# Patient Record
Sex: Male | Born: 1980 | Race: Black or African American | Hispanic: No | Marital: Single | Smoking: Former smoker
Health system: Southern US, Community
[De-identification: ages and names within clinical notes are randomized; demographics above are authoritative.]

## PROBLEM LIST (undated history)

## (undated) DIAGNOSIS — I10 Essential (primary) hypertension: Secondary | ICD-10-CM

## (undated) DIAGNOSIS — L509 Urticaria, unspecified: Secondary | ICD-10-CM

## (undated) HISTORY — PX: ANTERIOR CRUCIATE LIGAMENT REPAIR: SHX115

## (undated) HISTORY — PX: MENISCUS REPAIR: SHX5179

---

## 2010-08-29 ENCOUNTER — Emergency Department (HOSPITAL_COMMUNITY): Payer: Medicaid - Out of State

## 2010-08-29 ENCOUNTER — Emergency Department (HOSPITAL_COMMUNITY)
Admission: EM | Admit: 2010-08-29 | Discharge: 2010-08-29 | Disposition: A | Discharge status: Home / Self Care | Payer: Medicaid - Out of State | Attending: Emergency Medicine | Admitting: Emergency Medicine

## 2010-08-29 DIAGNOSIS — R079 Chest pain, unspecified: Secondary | ICD-10-CM | POA: Insufficient documentation

## 2010-08-29 LAB — CBC
HCT: 39.5 % (ref 39.0–52.0)
Hemoglobin: 13.5 g/dL (ref 13.0–17.0)
RBC: 4.47 MIL/uL (ref 4.22–5.81)
WBC: 13.1 10*3/uL — ABNORMAL HIGH (ref 4.0–10.5)

## 2010-08-29 LAB — POCT I-STAT, CHEM 8
Creatinine, Ser: 1.1 mg/dL (ref 0.4–1.5)
Glucose, Bld: 153 mg/dL — ABNORMAL HIGH (ref 70–99)
Hemoglobin: 13.9 g/dL (ref 13.0–17.0)
Potassium: 4.4 mEq/L (ref 3.5–5.1)

## 2010-08-29 LAB — POCT CARDIAC MARKERS
CKMB, poc: <1 ng/mL — ABNORMAL LOW (ref 1.0–8.0)
CKMB, poc: <1 ng/mL — ABNORMAL LOW (ref 1.0–8.0)
Myoglobin, poc: 194 ng/mL (ref 12–200)
Troponin i, poc: <0.05 ng/mL (ref 0.00–0.09)

## 2010-08-29 LAB — DIFFERENTIAL
Basophils Absolute: 0 10*3/uL (ref 0.0–0.1)
Lymphocytes Relative: 13 % (ref 12–46)
Monocytes Absolute: 1.4 10*3/uL — ABNORMAL HIGH (ref 0.1–1.0)
Neutro Abs: 10 10*3/uL — ABNORMAL HIGH (ref 1.7–7.7)

## 2010-08-29 MED ORDER — IOHEXOL 300 MG/ML  SOLN
80.0000 mL | Freq: Once | INTRAMUSCULAR | Status: AC | PRN
  Administered 2010-08-29: 80 mL via INTRAVENOUS

## 2010-08-30 ENCOUNTER — Inpatient Hospital Stay (INDEPENDENT_AMBULATORY_CARE_PROVIDER_SITE_OTHER)
Admission: RE | Admit: 2010-08-30 | Discharge: 2010-08-30 | Disposition: A | Discharge status: Home / Self Care | Payer: Medicaid - Out of State | Source: Ambulatory Visit | Attending: Family Medicine | Admitting: Family Medicine

## 2010-08-30 DIAGNOSIS — M79609 Pain in unspecified limb: Secondary | ICD-10-CM

## 2010-09-06 ENCOUNTER — Ambulatory Visit: Payer: Medicaid - Out of State | Attending: Emergency Medicine | Admitting: Physical Therapy

## 2010-09-06 DIAGNOSIS — IMO0001 Reserved for inherently not codable concepts without codable children: Secondary | ICD-10-CM | POA: Insufficient documentation

## 2010-09-06 DIAGNOSIS — M25669 Stiffness of unspecified knee, not elsewhere classified: Secondary | ICD-10-CM | POA: Insufficient documentation

## 2010-09-06 DIAGNOSIS — R262 Difficulty in walking, not elsewhere classified: Secondary | ICD-10-CM | POA: Insufficient documentation

## 2010-09-06 DIAGNOSIS — M25569 Pain in unspecified knee: Secondary | ICD-10-CM | POA: Insufficient documentation

## 2010-11-13 ENCOUNTER — Ambulatory Visit: Payer: Medicaid Other | Attending: Emergency Medicine

## 2010-11-13 DIAGNOSIS — IMO0001 Reserved for inherently not codable concepts without codable children: Secondary | ICD-10-CM | POA: Insufficient documentation

## 2010-11-13 DIAGNOSIS — M25569 Pain in unspecified knee: Secondary | ICD-10-CM | POA: Insufficient documentation

## 2010-11-13 DIAGNOSIS — M25669 Stiffness of unspecified knee, not elsewhere classified: Secondary | ICD-10-CM | POA: Insufficient documentation

## 2010-11-13 DIAGNOSIS — R262 Difficulty in walking, not elsewhere classified: Secondary | ICD-10-CM | POA: Insufficient documentation

## 2010-11-15 ENCOUNTER — Ambulatory Visit: Payer: Medicaid Other | Admitting: Physical Therapy

## 2010-11-21 ENCOUNTER — Encounter: Payer: Medicaid - Out of State | Admitting: Physical Therapy

## 2010-11-22 ENCOUNTER — Ambulatory Visit: Payer: Medicaid Other

## 2010-11-23 ENCOUNTER — Ambulatory Visit: Payer: Medicaid Other

## 2010-11-26 ENCOUNTER — Ambulatory Visit: Payer: Medicaid Other | Admitting: Physical Therapy

## 2010-11-28 ENCOUNTER — Ambulatory Visit: Payer: Medicaid Other

## 2010-11-30 ENCOUNTER — Ambulatory Visit: Payer: Medicaid Other

## 2010-12-10 ENCOUNTER — Ambulatory Visit: Payer: Medicaid Other | Attending: Emergency Medicine

## 2010-12-10 DIAGNOSIS — M25669 Stiffness of unspecified knee, not elsewhere classified: Secondary | ICD-10-CM | POA: Insufficient documentation

## 2010-12-10 DIAGNOSIS — R262 Difficulty in walking, not elsewhere classified: Secondary | ICD-10-CM | POA: Insufficient documentation

## 2010-12-10 DIAGNOSIS — IMO0001 Reserved for inherently not codable concepts without codable children: Secondary | ICD-10-CM | POA: Insufficient documentation

## 2010-12-10 DIAGNOSIS — M25569 Pain in unspecified knee: Secondary | ICD-10-CM | POA: Insufficient documentation

## 2010-12-26 ENCOUNTER — Ambulatory Visit: Payer: Medicaid Other

## 2010-12-27 ENCOUNTER — Ambulatory Visit: Payer: Medicaid Other

## 2010-12-28 ENCOUNTER — Ambulatory Visit: Payer: Medicaid Other

## 2011-01-02 ENCOUNTER — Ambulatory Visit: Payer: Medicaid Other

## 2011-01-03 ENCOUNTER — Ambulatory Visit: Payer: Medicaid Other

## 2011-01-04 ENCOUNTER — Ambulatory Visit: Payer: Medicaid Other

## 2011-01-07 ENCOUNTER — Ambulatory Visit: Payer: Medicaid Other

## 2011-01-08 ENCOUNTER — Ambulatory Visit: Payer: Medicaid Other

## 2011-01-10 ENCOUNTER — Ambulatory Visit: Payer: Medicaid Other | Attending: Emergency Medicine

## 2011-01-10 DIAGNOSIS — M25669 Stiffness of unspecified knee, not elsewhere classified: Secondary | ICD-10-CM | POA: Insufficient documentation

## 2011-01-10 DIAGNOSIS — IMO0001 Reserved for inherently not codable concepts without codable children: Secondary | ICD-10-CM | POA: Insufficient documentation

## 2011-01-10 DIAGNOSIS — R262 Difficulty in walking, not elsewhere classified: Secondary | ICD-10-CM | POA: Insufficient documentation

## 2011-01-10 DIAGNOSIS — M25569 Pain in unspecified knee: Secondary | ICD-10-CM | POA: Insufficient documentation

## 2011-01-11 ENCOUNTER — Ambulatory Visit: Payer: Medicaid Other

## 2011-01-22 ENCOUNTER — Ambulatory Visit: Payer: Medicaid Other

## 2011-01-25 ENCOUNTER — Ambulatory Visit: Payer: Medicaid Other

## 2011-01-28 ENCOUNTER — Ambulatory Visit: Payer: Medicaid Other

## 2011-01-30 ENCOUNTER — Ambulatory Visit: Payer: Medicaid Other

## 2011-02-01 ENCOUNTER — Ambulatory Visit: Payer: Medicaid Other

## 2011-02-13 ENCOUNTER — Ambulatory Visit: Payer: Medicaid Other | Attending: Emergency Medicine

## 2011-02-13 DIAGNOSIS — M25669 Stiffness of unspecified knee, not elsewhere classified: Secondary | ICD-10-CM | POA: Insufficient documentation

## 2011-02-13 DIAGNOSIS — R262 Difficulty in walking, not elsewhere classified: Secondary | ICD-10-CM | POA: Insufficient documentation

## 2011-02-13 DIAGNOSIS — IMO0001 Reserved for inherently not codable concepts without codable children: Secondary | ICD-10-CM | POA: Insufficient documentation

## 2011-02-13 DIAGNOSIS — M25569 Pain in unspecified knee: Secondary | ICD-10-CM | POA: Insufficient documentation

## 2011-02-21 ENCOUNTER — Ambulatory Visit: Payer: Medicaid Other

## 2011-02-26 ENCOUNTER — Ambulatory Visit: Payer: Medicaid Other

## 2011-05-18 ENCOUNTER — Emergency Department (HOSPITAL_COMMUNITY): Payer: Medicaid Other

## 2011-05-18 ENCOUNTER — Emergency Department (HOSPITAL_COMMUNITY)
Admission: EM | Admit: 2011-05-18 | Discharge: 2011-05-18 | Disposition: A | Discharge status: Home / Self Care | Payer: Medicaid Other | Attending: Emergency Medicine | Admitting: Emergency Medicine

## 2011-05-18 ENCOUNTER — Encounter: Payer: Self-pay | Admitting: *Deleted

## 2011-05-18 ENCOUNTER — Other Ambulatory Visit: Payer: Self-pay

## 2011-05-18 DIAGNOSIS — R0602 Shortness of breath: Secondary | ICD-10-CM | POA: Insufficient documentation

## 2011-05-18 DIAGNOSIS — M79609 Pain in unspecified limb: Secondary | ICD-10-CM | POA: Insufficient documentation

## 2011-05-18 DIAGNOSIS — R079 Chest pain, unspecified: Secondary | ICD-10-CM | POA: Insufficient documentation

## 2011-05-18 DIAGNOSIS — I10 Essential (primary) hypertension: Secondary | ICD-10-CM | POA: Insufficient documentation

## 2011-05-18 HISTORY — DX: Essential (primary) hypertension: I10

## 2011-05-18 LAB — BASIC METABOLIC PANEL
CO2: 29 mEq/L (ref 19–32)
Chloride: 103 mEq/L (ref 96–112)
GFR calc Af Amer: >90 mL/min (ref >90)
Sodium: 142 mEq/L (ref 135–145)

## 2011-05-18 LAB — CBC
Platelets: 242 10*3/uL (ref 150–400)
RBC: 5.08 MIL/uL (ref 4.22–5.81)
WBC: 10.5 10*3/uL (ref 4.0–10.5)

## 2011-05-18 LAB — POCT I-STAT TROPONIN I
Troponin i, poc: 0 ng/mL (ref 0.00–0.08)
Troponin i, poc: 0.01 ng/mL (ref 0.00–0.08)

## 2011-05-18 MED ORDER — POTASSIUM CHLORIDE CRYS ER 20 MEQ PO TBCR
20.0000 meq | EXTENDED_RELEASE_TABLET | Freq: Once | ORAL | Status: AC
  Administered 2011-05-18: 20 meq via ORAL
  Filled 2011-05-18: qty 1

## 2011-05-18 MED ORDER — NITROGLYCERIN 0.4 MG SL SUBL
0.4000 mg | SUBLINGUAL_TABLET | SUBLINGUAL | Status: DC | PRN
  Administered 2011-05-18: 0.4 mg via SUBLINGUAL
  Filled 2011-05-18: qty 25

## 2011-05-18 MED ORDER — ASPIRIN 81 MG PO CHEW
324.0000 mg | CHEWABLE_TABLET | Freq: Once | ORAL | Status: AC
  Administered 2011-05-18: 324 mg via ORAL
  Filled 2011-05-18: qty 4

## 2011-05-18 MED ORDER — IOHEXOL 300 MG/ML  SOLN
100.0000 mL | Freq: Once | INTRAMUSCULAR | Status: AC | PRN
  Administered 2011-05-18: 100 mL via INTRAVENOUS

## 2011-05-18 MED ORDER — ASPIRIN 81 MG PO CHEW: 81.0000 mg | CHEWABLE_TABLET | Freq: Every day | ORAL | Status: DC

## 2011-05-18 NOTE — ED Notes (Signed)
Pt in c/o left sided chest pain and shortness of breath, pain radiates into jaw and left arm, no history of same, states he was driving when pain started, pain not increased with movement

## 2011-05-18 NOTE — ED Provider Notes (Signed)
History     CSN: [id number] Arrival date & time: 05/18/2011 12:09 AM   First MD Initiated Contact with Patient 05/18/11 0157      Chief Complaint  Patient presents with  . Chest Pain    (Consider location/radiation/quality/duration/timing/severity/associated sxs/prior treatment) Patient is a 30 y.o. male presenting with chest pain.  Chest Pain The chest pain began 3 - 5 hours ago. Duration of episode(s) is 1 second. Chest pain occurs frequently. The chest pain is resolved. Associated with: Nothing. The severity of the pain is moderate. The quality of the pain is described as sharp. The pain does not radiate. Exacerbated by: Nothing. Pertinent negatives for primary symptoms include no fever, no fatigue, no syncope, no shortness of breath, no cough, no wheezing, no palpitations, no abdominal pain, no vomiting, no dizziness and no altered mental status.  Pertinent negatives for associated symptoms include no diaphoresis. He tried nothing for the symptoms. Risk factors include male gender and smoking/tobacco exposure.  Pertinent negatives for past medical history include no CAD.    at home tonight had left-sided chest pain he describes as sharp lasting just a second and intermittent for about 30 minutes. He associated left arm numbness but no radiation of pain. No history of similar symptoms. No family history of coronary artery disease. No cough or recent illness. Symptoms resolved is currently pain-free.  Past Medical History  Diagnosis Date  . Hypertension     History reviewed. No pertinent past surgical history.  History reviewed. No pertinent family history.  History  Substance Use Topics  . Smoking status: Current Everyday Smoker  . Smokeless tobacco: Not on file  . Alcohol Use: No      Review of Systems  Constitutional: Negative for fever, chills, diaphoresis and fatigue.  HENT: Negative for neck pain and neck stiffness.   Eyes: Negative for pain.  Respiratory: Negative  for cough, shortness of breath and wheezing.   Cardiovascular: Positive for chest pain. Negative for palpitations and syncope.  Gastrointestinal: Negative for vomiting and abdominal pain.  Genitourinary: Negative for dysuria.  Musculoskeletal: Negative for back pain.  Skin: Negative for rash.  Neurological: Negative for dizziness and headaches.  Psychiatric/Behavioral: Negative for altered mental status.  All other systems reviewed and are negative.    Allergies  Ibuprofen; Lisinopril; Naproxen; and Penicillins  Home Medications   Current Outpatient Rx  Name Route Sig Dispense Refill  . ACETAMINOPHEN 500 MG PO TABS Oral Take 1,000 mg by mouth every 6 (six) hours as needed.      Marland Kitchen AMLODIPINE BESYLATE 5 MG PO TABS Oral Take 5 mg by mouth daily.      . TERBINAFINE HCL 250 MG PO TABS Oral Take 250 mg by mouth daily.        BP 140/78  Pulse 80  Temp(Src) 97.7 F (36.5 C) (Oral)  Resp 18  SpO2 99%  Physical Exam  Constitutional: He is oriented to person, place, and time. He appears well-developed and well-nourished.  HENT:  Head: Normocephalic and atraumatic.  Eyes: Conjunctivae and EOM are normal. Pupils are equal, round, and reactive to light.  Neck: Trachea normal. Neck supple. No thyromegaly present.  Cardiovascular: Normal rate, regular rhythm, S1 normal, S2 normal and normal pulses.     No systolic murmur is present   No diastolic murmur is present  Pulses:      Radial pulses are 2+ on the right side, and 2+ on the left side.  Pulmonary/Chest: Effort normal and breath sounds normal.  He has no wheezes. He has no rhonchi. He has no rales. He exhibits no tenderness.  Abdominal: Soft. Normal appearance and bowel sounds are normal. There is no tenderness. There is no CVA tenderness and negative Murphy's sign.  Musculoskeletal:       BLE:s Calves nontender, no cords or erythema, negative Homans sign  Neurological: He is alert and oriented to person, place, and time. He has  normal strength. No cranial nerve deficit or sensory deficit. GCS eye subscore is 4. GCS verbal subscore is 5. GCS motor subscore is 6.  Skin: Skin is warm and dry. No rash noted. He is not diaphoretic.  Psychiatric: His speech is normal.       Cooperative and appropriate    ED Course  Procedures (including critical care time)  Labs Reviewed  BASIC METABOLIC PANEL - Abnormal; Notable for the following:    Potassium 3.2 (*)    GFR calc non Af Amer 88 (*)    All other components within normal limits  D-DIMER, QUANTITATIVE - Abnormal; Notable for the following:    D-Dimer, Quant 0.74 (*)    All other components within normal limits  CBC  POCT I-STAT TROPONIN I  POCT I-STAT TROPONIN I  I-STAT TROPONIN I  I-STAT TROPONIN I   Dg Chest 2 View  05/18/2011  *RADIOLOGY REPORT*  Clinical Data: Left-sided chest pain.  Shortness of breath.  Smoker with history of hypertension.  CHEST - 2 VIEW 05/18/2011:  Comparison: CTA chest [facility]/29/2010 [facility].  Findings: Cardiomediastinal silhouette unremarkable.  Lungs clear. Bronchovascular markings normal.  Pulmonary vascularity normal.  No pleural effusions.  No pneumothorax.  Visualized bony thorax intact.  IMPRESSION: Normal examination.  Original Report Authenticated By: Arnell Sieving, M.D.   Ct Angio Chest W/cm &/or Wo Cm  05/18/2011  *RADIOLOGY REPORT*  Clinical Data:  Chest pain.  Left-sided chest pain and short of breath.  Pain radiating into the left arm.  CT ANGIOGRAPHY CHEST WITH CONTRAST  Technique:  Multidetector CT imaging of the chest was performed using the standard protocol during bolus administration of intravenous contrast.  Multiplanar CT image reconstructions including MIPs were obtained to evaluate the vascular anatomy.  Contrast: OMNIPAQUE IOHEXOL 300 MG/ML IV SOLN  Comparison:  05/18/2011 chest radiograph.  Findings:  Technically adequate study without pulmonary embolus. Bovine aortic arch.  No acute aortic  abnormality.  The heart appears within normal limits.  Residual anterior mediastinal thymic tissue.  No axillary adenopathy.  No mediastinal or hilar adenopathy.  No pericardial or pleural effusion is present. Patulous gastroesophageal junction is present.  The lung windows show no airspace disease.  Mild dependent atelectasis.  Review of the MIP images confirms the above findings.  IMPRESSION: Negative for pulmonary embolus or acute abnormality.  Original Report Authenticated By: Andreas Newport, M.D.     Date: 05/18/2011  Rate: 92  Rhythm: normal sinus rhythm  QRS Axis: normal  Intervals: normal  ST/T Wave abnormalities: nonspecific ST/T changes  Conduction Disutrbances:none  Narrative Interpretation:   Old EKG Reviewed: none available    MDM  Chest pain sounds very atypical for ACS. Evaluated as above. Elevated d-dimer so CT scan was obtained it does not have a PE. Patient does have inverted T waves inferior laterally. No symptoms while emergency department. All signs within normal limits. He is followed closely by primary care. Serial troponins within normal limits and no symptoms in emergency department. Plan close followup with cardiology for outpatient stress testing.  Sunnie Nielsen, MD 05/18/11 [phone]

## 2011-06-07 ENCOUNTER — Emergency Department (INDEPENDENT_AMBULATORY_CARE_PROVIDER_SITE_OTHER): Payer: Medicaid Other

## 2011-06-07 ENCOUNTER — Emergency Department (HOSPITAL_BASED_OUTPATIENT_CLINIC_OR_DEPARTMENT_OTHER): Payer: Medicaid Other

## 2011-06-07 ENCOUNTER — Emergency Department (HOSPITAL_BASED_OUTPATIENT_CLINIC_OR_DEPARTMENT_OTHER)
Admission: EM | Admit: 2011-06-07 | Discharge: 2011-06-07 | Disposition: A | Discharge status: Home / Self Care | Payer: Medicaid Other | Attending: Emergency Medicine | Admitting: Emergency Medicine

## 2011-06-07 ENCOUNTER — Encounter (HOSPITAL_BASED_OUTPATIENT_CLINIC_OR_DEPARTMENT_OTHER): Payer: Self-pay | Admitting: Emergency Medicine

## 2011-06-07 DIAGNOSIS — M25469 Effusion, unspecified knee: Secondary | ICD-10-CM

## 2011-06-07 DIAGNOSIS — X58XXXA Exposure to other specified factors, initial encounter: Secondary | ICD-10-CM

## 2011-06-07 DIAGNOSIS — F17200 Nicotine dependence, unspecified, uncomplicated: Secondary | ICD-10-CM | POA: Insufficient documentation

## 2011-06-07 DIAGNOSIS — Z79899 Other long term (current) drug therapy: Secondary | ICD-10-CM | POA: Insufficient documentation

## 2011-06-07 DIAGNOSIS — M898X9 Other specified disorders of bone, unspecified site: Secondary | ICD-10-CM

## 2011-06-07 DIAGNOSIS — M25569 Pain in unspecified knee: Secondary | ICD-10-CM

## 2011-06-07 NOTE — ED Notes (Signed)
Patient states that he has a knee brace and crutches at home from prior knee injury.  Patient reports that he will use those instead of being fitted for new brace and crutches.  Patient reports that he knows how to use the crutches properly.

## 2011-06-07 NOTE — ED Provider Notes (Signed)
History     CSN: [id number]  Arrival date & time 06/07/11  2043   First MD Initiated Contact with Patient 06/07/11 2148      Chief Complaint  Patient presents with  . Knee Injury    (Consider location/radiation/quality/duration/timing/severity/associated sxs/prior treatment) HPI  Patient with history of left anterior cruciate ligament repair. He states he was running 3 days ago and has noted pain and swelling in his left knee since that time. He did not fall after a trauma. He has not had redness or fever.  Past Medical History  Diagnosis Date  . Hypertension     Past Surgical History  Procedure Date  . Anterior cruciate ligament repair   . Meniscus repair     No family history on file.  History  Substance Use Topics  . Smoking status: Current Everyday Smoker  . Smokeless tobacco: Not on file  . Alcohol Use: No      Review of Systems  All other systems reviewed and are negative.    Allergies  Ibuprofen; Lisinopril; Naproxen; and Penicillins  Home Medications   Current Outpatient Rx  Name Route Sig Dispense Refill  . ACETAMINOPHEN 500 MG PO TABS Oral Take 1,000 mg by mouth every 6 (six) hours as needed. For pain    . ACETAMINOPHEN-CODEINE #3 300-30 MG PO TABS Oral Take 2 tablets by mouth every 4 (four) hours as needed. For pain     . AMLODIPINE BESYLATE 5 MG PO TABS Oral Take 5 mg by mouth daily.      Marland Kitchen CETIRIZINE HCL 10 MG PO TABS Oral Take 10 mg by mouth daily.      Marland Kitchen DIPHENHYDRAMINE HCL 25 MG PO TABS Oral Take 50 mg by mouth every 6 (six) hours as needed. For allergic reaction     . DIPHENHYDRAMINE-APAP (SLEEP) 25-500 MG PO TABS Oral Take 1 tablet by mouth at bedtime as needed. For sleep     . HYDROXYZINE HCL 25 MG PO TABS Oral Take 25 mg by mouth every 6 (six) hours as needed. For itching     . LORATADINE 10 MG PO TABS Oral Take 10 mg by mouth daily.      . TERBINAFINE HCL 250 MG PO TABS Oral Take 250 mg by mouth daily.        BP 162/93  Pulse 90   Temp(Src) 98.4 F (36.9 C) (Oral)  Resp 18  SpO2 99%  Physical Exam  Nursing note and vitals reviewed. Constitutional: He appears well-developed and well-nourished.  HENT:  Head: Normocephalic.  Eyes: Pupils are equal, round, and reactive to light.  Neck: Normal range of motion. Neck supple.  Cardiovascular: Normal rate.   Pulmonary/Chest: Effort normal.  Abdominal: Soft.  Musculoskeletal:       Patient with diffuse swelling and tenderness. He has full active range of motion without any ligamentous laxity noted on exam. He is neurovascularly intact distal to the injury.  Neurological: He is alert.  Skin: Skin is warm and dry.  Psychiatric: He has a normal mood and affect.    ED Course  Procedures (including critical care time)  Labs Reviewed - No data to display Dg Knee 2 Views Left  06/07/2011  *RADIOLOGY REPORT*  Clinical Data: Knee pain after playing basketball.  Prior ACL repair.  LEFT KNEE - 1-2 VIEW  Comparison: None.  Findings: Tunnels from prior ACL repair noted.  Medial compartmental marginal spurring is present along with mild patellofemoral spurring.  A moderate and abnormal knee  effusion is present.  No fracture is observed.  IMPRESSION:  1.  Moderate knee effusion. 2.  Mild patellofemoral spurring and moderate medial compartmental marginal spurring. 3.  Tunnels associated with ACL repair are visible.  Original Report Authenticated By: Dellia Cloud, M.D.     No diagnosis found.    MDM          Hilario Quarry, MD 06/07/11 [phone]

## 2011-06-07 NOTE — ED Notes (Signed)
Pt c/o left knee pain after playing basketball today. Pt had ACL repair and meniscus transplant on left knee in march of this year

## 2011-06-29 ENCOUNTER — Encounter (HOSPITAL_COMMUNITY): Payer: Self-pay | Admitting: Emergency Medicine

## 2011-06-29 ENCOUNTER — Emergency Department (HOSPITAL_COMMUNITY)
Admission: EM | Admit: 2011-06-29 | Discharge: 2011-06-29 | Disposition: A | Discharge status: Home / Self Care | Payer: Medicaid Other | Attending: Emergency Medicine | Admitting: Emergency Medicine

## 2011-06-29 DIAGNOSIS — T7840XA Allergy, unspecified, initial encounter: Secondary | ICD-10-CM

## 2011-06-29 DIAGNOSIS — L2989 Other pruritus: Secondary | ICD-10-CM | POA: Insufficient documentation

## 2011-06-29 DIAGNOSIS — R6889 Other general symptoms and signs: Secondary | ICD-10-CM | POA: Insufficient documentation

## 2011-06-29 DIAGNOSIS — Z79899 Other long term (current) drug therapy: Secondary | ICD-10-CM | POA: Insufficient documentation

## 2011-06-29 DIAGNOSIS — J3489 Other specified disorders of nose and nasal sinuses: Secondary | ICD-10-CM | POA: Insufficient documentation

## 2011-06-29 DIAGNOSIS — L500 Allergic urticaria: Secondary | ICD-10-CM | POA: Insufficient documentation

## 2011-06-29 DIAGNOSIS — L298 Other pruritus: Secondary | ICD-10-CM | POA: Insufficient documentation

## 2011-06-29 DIAGNOSIS — T781XXA Other adverse food reactions, not elsewhere classified, initial encounter: Secondary | ICD-10-CM | POA: Insufficient documentation

## 2011-06-29 MED ORDER — DEXAMETHASONE SODIUM PHOSPHATE 10 MG/ML IJ SOLN
10.0000 mg | Freq: Once | INTRAMUSCULAR | Status: AC
  Administered 2011-06-29: 10 mg via INTRAMUSCULAR
  Filled 2011-06-29: qty 1

## 2011-06-29 MED ORDER — EPINEPHRINE 0.3 MG/0.3ML IJ DEVI: 0.3000 mg | INTRAMUSCULAR | Status: DC | PRN

## 2011-06-29 MED ORDER — DIPHENHYDRAMINE HCL 25 MG PO CAPS
25.0000 mg | ORAL_CAPSULE | Freq: Once | ORAL | Status: AC
  Administered 2011-06-29: 25 mg via ORAL
  Filled 2011-06-29: qty 1

## 2011-06-29 NOTE — ED Provider Notes (Signed)
History     CSN: [id number]  Arrival date & time 06/29/11  2536   First MD Initiated Contact with Patient 06/29/11 [phone]      Chief Complaint  Patient presents with  . Allergic Reaction    (Consider location/radiation/quality/duration/timing/severity/associated sxs/prior treatment) Patient is a 31 y.o. male presenting with allergic reaction. The history is provided by the patient. No language interpreter was used.  Allergic Reaction The primary symptoms do not include wheezing, shortness of breath, cough, abdominal pain, nausea, vomiting, diarrhea, dizziness, palpitations, rash or angioedema. The current episode started 13 to 24 hours ago. The problem has been gradually improving.  Significant symptoms also include itching. Significant symptoms that are not present include flushing.   patient presents to the ER 31 year old male complaining of a 24-hour history of high as. States he woke yesterday at 9 AM with hives and Benadryl with relief. States that he began getting hives again around 3:30 AM. He took 2 Benadryl at that time and went to bed. He woke up at 5:30 operative choking sensation. He wouldn't use his EpiPen and has slowly gotten better. He was taken off lisinopril one month ago for leg swelling and placed on Norvasc. He is he also will get aspirin 11 PM before he went to bed. States that he can feel her reaction came on after eating banana bread. [facility] is to [facility]. His allergist is Dr. Willa Rough. No hives presently. Airway and tact no acute distress.   Past Medical History  Diagnosis Date  . Hypertension     Past Surgical History  Procedure Date  . Anterior cruciate ligament repair   . Meniscus repair     History reviewed. No pertinent family history.  History  Substance Use Topics  . Smoking status: Current Everyday Smoker  . Smokeless tobacco: Not on file  . Alcohol Use: No      Review of Systems  Constitutional: Negative.  Negative for chills.  HENT:  Positive for congestion. Negative for ear pain, facial swelling, neck pain and ear discharge.   Eyes: Negative.   Respiratory: Negative.  Negative for cough, shortness of breath and wheezing.   Cardiovascular: Negative.  Negative for palpitations.  Gastrointestinal: Negative for nausea, vomiting, abdominal pain and diarrhea.  Skin: Positive for itching. Negative for flushing and rash.  Neurological: Negative for dizziness.  All other systems reviewed and are negative.    Allergies  Ibuprofen; Lisinopril; Naproxen; and Penicillins  Home Medications   Current Outpatient Rx  Name Route Sig Dispense Refill  . ACETAMINOPHEN-CODEINE #3 300-30 MG PO TABS Oral Take 1 tablet by mouth every 4 (four) hours as needed.    Marland Kitchen AMLODIPINE BESYLATE 5 MG PO TABS Oral Take 5 mg by mouth daily.      Marland Kitchen DIPHENHYDRAMINE HCL 25 MG PO TABS Oral Take 50 mg by mouth every 6 (six) hours as needed. For allergic reaction     . DIPHENHYDRAMINE-APAP (SLEEP) 25-500 MG PO TABS Oral Take 1 tablet by mouth at bedtime as needed. For sleep     . TERBINAFINE HCL 250 MG PO TABS Oral Take 250 mg by mouth daily.        BP 160/93  Pulse 100  Temp(Src) 98.9 F (37.2 C) (Oral)  Resp 20  SpO2 100%  Physical Exam  Nursing note and vitals reviewed. Constitutional: He is oriented to person, place, and time. He appears well-developed and well-nourished. No distress.  HENT:  Head: Normocephalic.  Eyes: Conjunctivae and EOM are  normal. Pupils are equal, round, and reactive to light.  Neck: Normal range of motion.  Cardiovascular: Normal rate and regular rhythm.  Exam reveals no gallop and no friction rub.   No murmur heard. Pulmonary/Chest: Effort normal and breath sounds normal.  Abdominal: Soft. Bowel sounds are normal.  Musculoskeletal: Normal range of motion. He exhibits no edema and no tenderness.  Neurological: He is alert and oriented to person, place, and time. Coordination normal.  Skin: Skin is warm and dry. No  rash noted.  Psychiatric: He has a normal mood and affect.    ED Course  Procedures (including critical care time)  Labs Reviewed - No data to display No results found.   No diagnosis found.    MDM  7:30 AMPatient now states  That he was started on a prednisone dose pack Thursday and Friday. He has been on and off prednisone and Benadryl for the past 2 years for hives.  8:30am patient states that he would like to go home now that he feels better that his throat does not feel swollen  Anymore. He will call his allergist Monday to follow up next week.  Continue all meds.  Do not eat any more bananna bread with nuts.          Jethro Bastos, NP 07/02/11 1240

## 2011-06-29 NOTE — ED Notes (Addendum)
Awakened around 5 a.m. With sensation of throat swelling---had eaten banana nut bread (walnuts) around 3 a.m.---Used his epi pen and had resolution of swelling within 5 minutes.  Lungs CTA  Pulse ox 100% on room air

## 2011-06-29 NOTE — ED Notes (Signed)
Pt presented to the ER with c/o allergic reaction, pt states he was exposed to some allergen, not sure, and about 40 min ago pt had to use Epi Pen since his throat was "closing up". At this time, pt in apparent distress noted, no Shob, airway intact, pt ab;e to communicate on full sentences.

## 2011-06-29 NOTE — ED Notes (Signed)
Resting on carrier---denies any sensation of throat swelling or SOA---pulse ox 99% on room air.

## 2011-06-29 NOTE — ED Notes (Signed)
Immediately after Decadron injection was given, pt. Stated "Oh, I forgot to tell the MD about a new medication--I have been on Prednisone for the past two days---MD notified.

## 2011-07-08 NOTE — ED Provider Notes (Signed)
Medical screening examination/treatment/procedure(s) were performed by non-physician practitioner and as supervising physician I was immediately available for consultation/collaboration.   Sakib Noguez, MD 07/08/11 0831 

## 2011-08-12 ENCOUNTER — Encounter: Payer: Self-pay | Admitting: Family Medicine

## 2011-08-12 ENCOUNTER — Ambulatory Visit (INDEPENDENT_AMBULATORY_CARE_PROVIDER_SITE_OTHER): Payer: Medicaid Other | Admitting: Family Medicine

## 2011-08-12 VITALS — BP 160/82 | HR 94 | Temp 98.2°F | Ht 69.0 in | Wt 218.0 lb

## 2011-08-12 DIAGNOSIS — M25562 Pain in left knee: Secondary | ICD-10-CM

## 2011-08-12 DIAGNOSIS — M25569 Pain in unspecified knee: Secondary | ICD-10-CM

## 2011-08-12 NOTE — Patient Instructions (Addendum)
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 You had the knee aspirated and injected today. Ok to take aleve 1-2 tabs twice a day with food, tylenol as needed. Ice knee 15 minutes at a time 3-4 times a day. ACE wrap for compression. Start quad strengthening exercises as directed - 3 sets of 10 once a day. Call me if you're not better in a week

## 2011-08-13 ENCOUNTER — Encounter: Payer: Self-pay | Admitting: Family Medicine

## 2011-08-13 DIAGNOSIS — M25562 Pain in left knee: Secondary | ICD-10-CM | POA: Insufficient documentation

## 2011-08-13 NOTE — Assessment & Plan Note (Signed)
radiographs showed mild DJD and did show a moderate effusion but these were back in December, appears to have gone down since these radiographs.  Without a new injury believe his pain is due to flare of arthritis.  Attempted aspiration but only 1-2 mL sanguinous fluid obtained - knee then injected with 3:1 marcaine:depomedrol which he tolerated well.  Shown home rehab exercises focusing on quad strengthening.  If not improving as expected would move forward with MRI of left knee to assess for new meniscal or ACL injury.  Of note we had discussed him taking aleve or other antiinflammatory but then noted he is allergic to these (ibuprofen, naproxen) so will have to use tylenol and the injection for pain relief.  After informed written consent patient was lying supine on exam table.  Left knee was prepped with iodine and alcohol swab.  Utilizing superolateral approach, 3 mL of marcaine was used for local anesthesia.  Then using an 18g needle on 60cc syringe, 1-2 mL of sanguinous fluid was aspirated from right knee.  Used ultrasound to assess for placement and appeared to be in correct location but not able to aspirate much from his knee.  Knee was then injected with 3:1 marcaine:depomedrol.  Patient tolerated procedure well without immediate complications

## 2011-08-13 NOTE — Progress Notes (Addendum)
Subjective:    Patient ID: Jesse Miranda, male    DOB: 1981/02/20, 31 y.o.   MRN: [id number]  PCP: [facility]  HPI 31 yo M here for left knee pain and swelling.  Patient reports 1 1/2 years ago he had left ACL reconstructed and meniscus repaired by Dr. Jimmye Norman in [place]. Was told he had torn these several years ago, believed to have been in high school. No new injuries since his reconstruction. Overall states he has done well since surgery only with occasional swelling. Then 3 weeks ago while playing basketball his knee became swollen again without an injury along with medial left knee pain. Reported catching, feeling of instability. Has been icing and soaking since then. No locking.  Past Medical History  Diagnosis Date  . Hypertension     Current Outpatient Prescriptions on File Prior to Visit  Medication Sig Dispense Refill  . amLODipine (NORVASC) 5 MG tablet Take 5 mg by mouth daily.        . diphenhydramine-acetaminophen (TYLENOL PM) 25-500 MG TABS Take 1 tablet by mouth at bedtime as needed. For sleep       . EPINEPHrine (EPIPEN) 0.3 mg/0.3 mL DEVI Inject 0.3 mLs (0.3 mg total) into the muscle as needed.  0.3 Device  1    Past Surgical History  Procedure Date  . Anterior cruciate ligament repair   . Meniscus repair     Allergies  Allergen Reactions  . Ibuprofen Hives    Lips swell  . Lisinopril Hives    Lip swelling.  . Naproxen Hives    Lips swell  . Penicillins Hives    Lips swell    History   Social History  . Marital Status: Single    Spouse Name: N/A    Number of Children: N/A  . Years of Education: N/A   Occupational History  . Not on file.   Social History Main Topics  . Smoking status: Former Games developer  . Smokeless tobacco: Not on file   Comment: Quit 3 months ago  . Alcohol Use: No  . Drug Use: No  . Sexually Active: Not on file   Other Topics Concern  . Not on file   Social History Narrative  . No narrative on file    Family  History  Problem Relation Age of Onset  . Hypertension Mother   . Hypertension Father   . Diabetes Neg Hx   . Heart attack Neg Hx   . Hyperlipidemia Neg Hx   . Sudden death Neg Hx     BP 160/82  Pulse 94  Temp(Src) 98.2 F (36.8 C) (Oral)  Ht 5\' 9"  (1.753 m)  Wt 218 lb (98.884 kg)  BMI 32.19 kg/m2  Review of Systems See HPI above.    Objective:   Physical Exam Gen: NAD  L knee: Mild effusion, no other deformity, ecchymoses. TTP medial joint line > lateral and post patellar facets. Full extension, lacks about 20 degrees flexion due to pain, swelling. And drawer 1+ but with end point - no laxity on right knee.  Negative lachmanns. Neg post drawer.  No laxity with valgus and varus stress. Pain medially with mcmurrays and apleys.  Negative patellar apprehension, clarkes. NV intact distally.  R knee: FROM without pain, swelling, instability.  MSK u/s: small effusion confirmed.    Assessment & Plan:  1. Left knee pain - radiographs showed mild DJD and did show a moderate effusion but these were back in December, appears to have  gone down since these radiographs.  Without a new injury believe his pain is due to flare of arthritis.  Attempted aspiration but only 1-2 mL sanguinous fluid obtained - knee then injected with 3:1 marcaine:depomedrol which he tolerated well.  Shown home rehab exercises focusing on quad strengthening.  If not improving as expected would move forward with MRI of left knee to assess for new meniscal or ACL injury.  Of note we had discussed him taking aleve or other antiinflammatory but then noted he is allergic to these (ibuprofen, naproxen) so will have to use tylenol and the injection for pain relief.  After informed written consent patient was lying supine on exam table.  Left knee was prepped with iodine and alcohol swab.  Utilizing superolateral approach, 3 mL of marcaine was used for local anesthesia.  Then using an 18g needle on 60cc syringe, 1-2 mL of  sanguinous fluid was aspirated from right knee.  Used ultrasound to assess for placement and appeared to be in correct location but not able to aspirate much from his knee.  Knee was then injected with 3:1 marcaine:depomedrol.  Patient tolerated procedure well without immediate complications  Addendum 3/12:  Patient reports his pain has not improved with aspiration/injection.  He reported pain starting 4 weeks ago but had pain back in December when went to ED - had effusion then as well.  Had done formal PT previously, I showed him additional home exercises past week.  We had discussed that an MRI would be next step as he's not improving but that with his insurance he may need to do full 6 weeks of home exercises before we can say he's failed PT (though he's done PT and home exercises in the past following his ACL reconstruction).  Addendum 3/18:  MRI results reviewed and discussed with patient.  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  Discussed I am not aware of any surgeons [place] who perform this surgery in the first place and recommend he take MRI report + images on a CD to the initial operative surgeon in [place] for follow-up - he will likely need additional operative intervention.  Other option is to go to a location Scientist, research (medical), ? Academic center) that performs these procedures for follow-up but it's always preferred to see the initial doctor if at all possible.  He is going to call to set up an appointment, call us back.  We will then put MRI on CD, print image report and notes for him to take with to that appointment.  Addendum 5/13:  Patient called requesting referral to a surgeon who does partial knee replacements.  He saw his prior surgeon in [place] who did his meniscal transplant surgery who advised him that his next step given tear, displacement of meniscal transplant, and  arthritis would be to go ahead with a partial knee replacement.  Will refer him to an orthopedic surgeon in [place] who does partial knee replacements for further evaluation, possible medial compartment partial knee replacement.  Addendum 6/17:  Patient's insurance coverage had run out - he is attempting to get new insurance now before he can see a Careers adviser.  Asked for pain medication - advised we could give him a one time script for vicodin but we will not refill this - pain medication is not the answer for this condition but ok to bridge one time until  he acquires insurance and can get in with an orthopedic surgeon.  Vicodin 5/500 1 tab po q6h prn severe pain #60, 0 refills

## 2011-08-20 NOTE — Progress Notes (Signed)
Addended by: Norton Blizzard R on: 08/20/2011 04:00 PM   Modules accepted: Orders

## 2011-08-23 NOTE — Progress Notes (Signed)
Addended by: Lenda Kelp on: 08/23/2011 08:56 AM   Modules accepted: Orders

## 2011-08-24 ENCOUNTER — Ambulatory Visit (HOSPITAL_BASED_OUTPATIENT_CLINIC_OR_DEPARTMENT_OTHER)
Admission: RE | Admit: 2011-08-24 | Discharge: 2011-08-24 | Disposition: A | Discharge status: Home / Self Care | Payer: Medicaid Other | Source: Ambulatory Visit | Attending: Family Medicine | Admitting: Family Medicine

## 2011-08-24 ENCOUNTER — Ambulatory Visit (INDEPENDENT_AMBULATORY_CARE_PROVIDER_SITE_OTHER)
Admission: RE | Admit: 2011-08-24 | Discharge: 2011-08-24 | Disposition: A | Discharge status: Home / Self Care | Payer: Medicaid Other | Source: Ambulatory Visit | Attending: Family Medicine | Admitting: Family Medicine

## 2011-08-24 DIAGNOSIS — M25469 Effusion, unspecified knee: Secondary | ICD-10-CM

## 2011-08-24 DIAGNOSIS — M25562 Pain in left knee: Secondary | ICD-10-CM

## 2011-08-24 DIAGNOSIS — Z0389 Encounter for observation for other suspected diseases and conditions ruled out: Secondary | ICD-10-CM

## 2011-08-24 DIAGNOSIS — M23329 Other meniscus derangements, posterior horn of medial meniscus, unspecified knee: Secondary | ICD-10-CM | POA: Insufficient documentation

## 2011-08-24 DIAGNOSIS — M25569 Pain in unspecified knee: Secondary | ICD-10-CM | POA: Insufficient documentation

## 2011-09-02 ENCOUNTER — Encounter (HOSPITAL_BASED_OUTPATIENT_CLINIC_OR_DEPARTMENT_OTHER): Payer: Self-pay | Admitting: Family Medicine

## 2011-09-02 ENCOUNTER — Emergency Department (HOSPITAL_BASED_OUTPATIENT_CLINIC_OR_DEPARTMENT_OTHER)
Admission: EM | Admit: 2011-09-02 | Discharge: 2011-09-02 | Disposition: A | Discharge status: Home / Self Care | Payer: Medicaid Other | Attending: Emergency Medicine | Admitting: Emergency Medicine

## 2011-09-02 DIAGNOSIS — Z87891 Personal history of nicotine dependence: Secondary | ICD-10-CM | POA: Insufficient documentation

## 2011-09-02 DIAGNOSIS — I10 Essential (primary) hypertension: Secondary | ICD-10-CM | POA: Insufficient documentation

## 2011-09-02 DIAGNOSIS — R070 Pain in throat: Secondary | ICD-10-CM | POA: Insufficient documentation

## 2011-09-02 DIAGNOSIS — J029 Acute pharyngitis, unspecified: Secondary | ICD-10-CM

## 2011-09-02 MED ORDER — AZITHROMYCIN 250 MG PO TABS: 250.0000 mg | ORAL_TABLET | Freq: Every day | ORAL | Status: DC

## 2011-09-02 MED ORDER — AZITHROMYCIN 250 MG PO TABS: 250.0000 mg | ORAL_TABLET | Freq: Every day | ORAL | Status: AC

## 2011-09-02 NOTE — Discharge Instructions (Signed)
Sore Throat Sore throats may be caused by bacteria and viruses. They may also be caused by:  Smoking.   Pollution.   Allergies.  If a sore throat is due to strep infection (a bacterial infection), you may need:  A throat swab.   A culture test to verify the strep infection.  You will need one of these:  An antibiotic shot.   Oral medicine for a full 10 days.  Strep infection is very contagious. A doctor should check any close contacts who have a sore throat or fever. A sore throat caused by a virus infection will usually last only 3-4 days. Antibiotics will not treat a viral sore throat.  Infectious mononucleosis (a viral disease), however, can cause a sore throat that lasts for up to 3 weeks. Mononucleosis can be diagnosed with blood tests. You must have been sick for at least 1 week in order for the test to give accurate results. HOME CARE INSTRUCTIONS   To treat a sore throat, take mild pain medicine.   Increase your fluids.   Eat a soft diet.   Do not smoke.   Gargling with warm water or salt water (1 tsp. salt in 8 oz. water) can be helpful.   Try throat sprays or lozenges or sucking on hard candy to ease the symptoms.  Call your doctor if your sore throat lasts longer than 1 week.  SEEK IMMEDIATE MEDICAL CARE IF:  You have difficulty breathing.   You have increased swelling in the throat.   You have pain so severe that you are unable to swallow fluids or your saliva.   You have a severe headache, a high fever, vomiting, or a red rash.  Document Released: 07/04/2004 Document Revised: 05/16/2011 Document Reviewed: 05/14/2007 ExitCare Patient Information 2012 [employer].Sore Throat Sore throats may be caused by bacteria and viruses. They may also be caused by:  Smoking.   Pollution.   Allergies.  If a sore throat is due to strep infection (a bacterial infection), you may need:  A throat swab.   A culture test to verify the strep infection.  You will  need one of these:  An antibiotic shot.   Oral medicine for a full 10 days.  Strep infection is very contagious. A doctor should check any close contacts who have a sore throat or fever. A sore throat caused by a virus infection will usually last only 3-4 days. Antibiotics will not treat a viral sore throat.  Infectious mononucleosis (a viral disease), however, can cause a sore throat that lasts for up to 3 weeks. Mononucleosis can be diagnosed with blood tests. You must have been sick for at least 1 week in order for the test to give accurate results. HOME CARE INSTRUCTIONS   To treat a sore throat, take mild pain medicine.   Increase your fluids.   Eat a soft diet.   Do not smoke.   Gargling with warm water or salt water (1 tsp. salt in 8 oz. water) can be helpful.   Try throat sprays or lozenges or sucking on hard candy to ease the symptoms.  Call your doctor if your sore throat lasts longer than 1 week.  SEEK IMMEDIATE MEDICAL CARE IF:  You have difficulty breathing.   You have increased swelling in the throat.   You have pain so severe that you are unable to swallow fluids or your saliva.   You have a severe headache, a high fever, vomiting, or a red rash.    Document Released: 07/04/2004 Document Revised: 05/16/2011 Document Reviewed: 05/14/2007 Exit[employer] Patient Information 2012 [employer]. 

## 2011-09-02 NOTE — ED Notes (Signed)
Pt c/o sore throat 6/10 and 10/10 when swallowing. Pt also reports cough.

## 2011-09-02 NOTE — ED Provider Notes (Signed)
Medical screening examination/treatment/procedure(s) were performed by non-physician practitioner and as supervising physician I was immediately available for consultation/collaboration.   Nat Christen, MD 09/02/11 [phone]

## 2011-09-02 NOTE — ED Provider Notes (Signed)
History     CSN: [id number]  Arrival date & time 09/02/11  1146   First MD Initiated Contact with Patient 09/02/11 1207      Chief Complaint  Patient presents with  . Sore Throat    (Consider location/radiation/quality/duration/timing/severity/associated sxs/prior treatment) Patient is a 31 y.o. male presenting with pharyngitis. The history is provided by the patient. No language interpreter was used.  Sore Throat This is a new problem. Episode onset: 3 days. The problem occurs constantly. The problem has been gradually worsening. Associated symptoms include a sore throat. The symptoms are aggravated by nothing. He has tried acetaminophen for the symptoms. The treatment provided no relief.  Pt reports his throat is painful.  Pt reports productive cough.  No shortness of breath  Past Medical History  Diagnosis Date  . Hypertension     Past Surgical History  Procedure Date  . Anterior cruciate ligament repair   . Meniscus repair     Family History  Problem Relation Age of Onset  . Hypertension Mother   . Hypertension Father   . Diabetes Neg Hx   . Heart attack Neg Hx   . Hyperlipidemia Neg Hx   . Sudden death Neg Hx     History  Substance Use Topics  . Smoking status: Former Games developer  . Smokeless tobacco: Not on file   Comment: Quit 3 months ago  . Alcohol Use: No      Review of Systems  HENT: Positive for sore throat.   All other systems reviewed and are negative.    Allergies  Ibuprofen; Lisinopril; Naproxen; and Penicillins  Home Medications   Current Outpatient Rx  Name Route Sig Dispense Refill  . AMLODIPINE BESYLATE 5 MG PO TABS Oral Take 5 mg by mouth daily.      Marland Kitchen DIPHENHYDRAMINE-APAP (SLEEP) 25-500 MG PO TABS Oral Take 1 tablet by mouth at bedtime as needed. For sleep     . EPINEPHRINE 0.3 MG/0.3ML IJ DEVI Intramuscular Inject 0.3 mLs (0.3 mg total) into the muscle as needed. 0.3 Device 1    BP 149/99  Pulse 79  Temp(Src) 97.9 F (36.6 C)  (Oral)  Resp 16  SpO2 100%  Physical Exam  Vitals reviewed. Constitutional: He is oriented to person, place, and time. He appears well-developed and well-nourished.  HENT:  Head: Normocephalic and atraumatic.  Right Ear: External ear normal.  Left Ear: External ear normal.  Nose: Nose normal.       Swollen red throat,    Eyes: Conjunctivae and EOM are normal. Pupils are equal, round, and reactive to light.  Neck: Normal range of motion. Neck supple.  Cardiovascular: Normal rate and normal heart sounds.   Pulmonary/Chest: Effort normal.  Abdominal: Soft.  Musculoskeletal: Normal range of motion.  Neurological: He is alert and oriented to person, place, and time.  Skin: Skin is warm.  Psychiatric: He has a normal mood and affect.    ED Course  Procedures (including critical care time)  Labs Reviewed - No data to display No results found.   No diagnosis found.    MDM  Pt given rx for zithromax        Elson Areas, [place] 09/02/11 1248

## 2011-10-21 NOTE — Progress Notes (Signed)
Addended by: Lenda Kelp on: 10/21/2011 12:58 PM   Modules accepted: Orders

## 2011-11-08 ENCOUNTER — Encounter (HOSPITAL_BASED_OUTPATIENT_CLINIC_OR_DEPARTMENT_OTHER): Payer: Self-pay

## 2011-11-08 ENCOUNTER — Emergency Department (HOSPITAL_BASED_OUTPATIENT_CLINIC_OR_DEPARTMENT_OTHER): Payer: Medicaid Other

## 2011-11-08 ENCOUNTER — Emergency Department (HOSPITAL_BASED_OUTPATIENT_CLINIC_OR_DEPARTMENT_OTHER)
Admission: EM | Admit: 2011-11-08 | Discharge: 2011-11-08 | Disposition: A | Discharge status: Home / Self Care | Payer: Medicaid Other | Attending: Emergency Medicine | Admitting: Emergency Medicine

## 2011-11-08 DIAGNOSIS — M25569 Pain in unspecified knee: Secondary | ICD-10-CM | POA: Insufficient documentation

## 2011-11-08 DIAGNOSIS — Z87891 Personal history of nicotine dependence: Secondary | ICD-10-CM | POA: Insufficient documentation

## 2011-11-08 DIAGNOSIS — Z79899 Other long term (current) drug therapy: Secondary | ICD-10-CM | POA: Insufficient documentation

## 2011-11-08 DIAGNOSIS — M25562 Pain in left knee: Secondary | ICD-10-CM

## 2011-11-08 DIAGNOSIS — I10 Essential (primary) hypertension: Secondary | ICD-10-CM | POA: Insufficient documentation

## 2011-11-08 MED ORDER — OXYCODONE-ACETAMINOPHEN 5-325 MG PO TABS: 1.0000 | ORAL_TABLET | Freq: Four times a day (QID) | ORAL | Status: AC | PRN

## 2011-11-08 NOTE — Discharge Instructions (Signed)
Return to the ED with any concerns including redness of joint, fever, or any other alarming symptoms

## 2011-11-08 NOTE — ED Notes (Signed)
Left knee pain x 1 month.  States he has a torn mensicus.

## 2011-11-08 NOTE — ED Provider Notes (Signed)
History     CSN: [id number]  Arrival date & time 11/08/11  1327   First MD Initiated Contact with Patient 11/08/11 1338      Chief Complaint  Patient presents with  . Knee Pain    (Consider location/radiation/quality/duration/timing/severity/associated sxs/prior treatment) HPI Pt presents with c/o left knee pain.  The pain is chronic in nature and has been going on for more than one month.  Pt has a hx of ACL repair in the past and was diagnosed approx 2 months ago with meniscal tear per his report.  He states the pain and swelling of left knee is continuing to worsen.  No new injuries reported.  Has tried tylenol which is not helping with pain.  Pt states he is supposed to be getting scheduled for knee surgery soon.  Has a knee immobilizer but has not been wearing this.  There are no associated systemic symptoms.  There is no fever, no redness.  Movement and palpation makes pain worse.   Past Medical History  Diagnosis Date  . Hypertension     Past Surgical History  Procedure Date  . Anterior cruciate ligament repair   . Meniscus repair     Family History  Problem Relation Age of Onset  . Hypertension Mother   . Hypertension Father   . Diabetes Neg Hx   . Heart attack Neg Hx   . Hyperlipidemia Neg Hx   . Sudden death Neg Hx     History  Substance Use Topics  . Smoking status: Former Games developer  . Smokeless tobacco: Never Used   Comment: Quit 3 months ago  . Alcohol Use: No      Review of Systems ROS reviewed and all otherwise negative except for mentioned in HPI  Allergies  Ibuprofen; Lisinopril; Naproxen; and Penicillins  Home Medications   Current Outpatient Rx  Name Route Sig Dispense Refill  . CYCLOSPORINE 100 MG PO CAPS Oral Take 150 mg by mouth 2 (two) times daily.    Marland Kitchen AMLODIPINE BESYLATE 5 MG PO TABS Oral Take 5 mg by mouth daily.      Marland Kitchen DIPHENHYDRAMINE-APAP (SLEEP) 25-500 MG PO TABS Oral Take 1 tablet by mouth at bedtime as needed. For sleep     .  EPINEPHRINE 0.3 MG/0.3ML IJ DEVI Intramuscular Inject 0.3 mLs (0.3 mg total) into the muscle as needed. 0.3 Device 1  . OXYCODONE-ACETAMINOPHEN 5-325 MG PO TABS Oral Take 1-2 tablets by mouth every 6 (six) hours as needed for pain. 15 tablet 0    BP 160/85  Pulse 102  Temp(Src) 97.7 F (36.5 C) (Oral)  Resp 16  Ht 5\' 9"  (1.753 m)  Wt 215 lb (97.523 kg)  BMI 31.75 kg/m2  SpO2 98% Vitals reviewed Physical Exam Physical Examination: General appearance - alert, well appearing, and in no distress Mental status - alert, oriented to person, place, and time Eyes - no conjunctival injection, no scleral icterus Neurological - alert, oriented, normal speech, strength 5/5 in extremities x 4 Musculoskeletal - some ttp over medial joint line, well healed scars from prior surgeries, no balottable effusion, mild swelling of left knee joint compared to right side Extremities - peripheral pulses normal, no pedal edema, no clubbing or cyanosis Skin - normal coloration and turgor, no rashes  ED Course  Procedures (including critical care time)  Labs Reviewed - No data to display Dg Knee Complete 4 Views Left  11/08/2011  *RADIOLOGY REPORT*  Clinical Data: Painful swollen knee  LEFT KNEE -  COMPLETE 4+ VIEW  Comparison: Left knee films of 06/07/2011  Findings: No acute bony abnormality is seen.  Changes of repair of the ACL are noted.  However there does appear to be a joint effusion on the lateral view.  IMPRESSION: Probable left knee joint effusion.  No acute bony abnormality  Original Report Authenticated By: Juline Patch, M.D.     1. Pain in left knee       MDM  Pt presents with c/o left knee pain.  Pt has known meniscal tear and is to have surgery soon.  Xray shows some joint effusion.  Pt advised to wear knee immoblizer that he states he has at home.  Given meds for pain control.  Advised to notify his ortho about the pain he is experiencing and folllow up with them.  Pt discharged with strict  return precautions, he is agreeable with this plan.         Ethelda Chick, MD 11/08/11 1420

## 2011-11-28 ENCOUNTER — Other Ambulatory Visit: Payer: Self-pay | Admitting: *Deleted

## 2011-11-28 MED ORDER — HYDROCODONE-ACETAMINOPHEN 5-500 MG PO TABS: 1.0000 | ORAL_TABLET | Freq: Four times a day (QID) | ORAL | Status: AC | PRN

## 2011-12-10 ENCOUNTER — Emergency Department (HOSPITAL_BASED_OUTPATIENT_CLINIC_OR_DEPARTMENT_OTHER)
Admission: EM | Admit: 2011-12-10 | Discharge: 2011-12-10 | Disposition: A | Discharge status: Home / Self Care | Payer: Medicaid Other | Attending: Emergency Medicine | Admitting: Emergency Medicine

## 2011-12-10 ENCOUNTER — Encounter (HOSPITAL_BASED_OUTPATIENT_CLINIC_OR_DEPARTMENT_OTHER): Payer: Self-pay

## 2011-12-10 DIAGNOSIS — I10 Essential (primary) hypertension: Secondary | ICD-10-CM | POA: Insufficient documentation

## 2011-12-10 DIAGNOSIS — R109 Unspecified abdominal pain: Secondary | ICD-10-CM

## 2011-12-10 DIAGNOSIS — Z87891 Personal history of nicotine dependence: Secondary | ICD-10-CM | POA: Insufficient documentation

## 2011-12-10 HISTORY — DX: Urticaria, unspecified: L50.9

## 2011-12-10 LAB — URINALYSIS, ROUTINE W REFLEX MICROSCOPIC
Bilirubin Urine: NEGATIVE
Ketones, ur: NEGATIVE mg/dL
Nitrite: NEGATIVE
Urobilinogen, UA: 1 mg/dL (ref 0.0–1.0)

## 2011-12-10 NOTE — ED Provider Notes (Signed)
History     CSN: [id number]  Arrival date & time 12/10/11  1417   First MD Initiated Contact with Patient 12/10/11 1539      Chief Complaint  Patient presents with  . Abdominal Pain    (Consider location/radiation/quality/duration/timing/severity/associated sxs/prior treatment) HPI This 31 year old male is now pain free but had a couple hours of sudden sharp moderately severe epigastric abdominal pain prior to arrival. He had no associated symptoms such as nausea vomiting or diarrhea. He also had no testicular pain or dysuria or flank pain today. Yesterday he had a few episodes lasting several minutes each of some low back pain and slight testicular pain but no swelling or tenderness to his testicles at that time. He has no back or testicular pain today. He is no chest pain cough or shortness of breath. He again is now asymptomatic. He has no pain in his extremities no weakness or numbness. There is no treatment prior to arrival. He was feeling well all day prior to this episode. Past Medical History  Diagnosis Date  . Hypertension   . Hives    takes cyclosporin for the hives  Past Surgical History  Procedure Date  . Anterior cruciate ligament repair   . Meniscus repair     Family History  Problem Relation Age of Onset  . Hypertension Mother   . Hypertension Father   . Diabetes Neg Hx   . Heart attack Neg Hx   . Hyperlipidemia Neg Hx   . Sudden death Neg Hx     History  Substance Use Topics  . Smoking status: Former Games developer  . Smokeless tobacco: Never Used   Comment: Quit 3 months ago  . Alcohol Use: No      Review of Systems  Constitutional: Negative for fever.       10 Systems reviewed and are negative for acute change except as noted in the HPI.  HENT: Negative for congestion.   Eyes: Negative for discharge and redness.  Respiratory: Negative for cough and shortness of breath.   Cardiovascular: Negative for chest pain.  Gastrointestinal: Positive for abdominal  pain. Negative for nausea, vomiting, diarrhea and blood in stool.  Genitourinary: Negative for dysuria, hematuria, discharge, scrotal swelling and testicular pain.  Musculoskeletal: Negative for back pain.  Skin: Negative for rash.  Neurological: Negative for syncope, numbness and headaches.  Psychiatric/Behavioral:       No behavior change.    Allergies  Ibuprofen; Lisinopril; Naproxen; and Penicillins  Home Medications   Current Outpatient Rx  Name Route Sig Dispense Refill  . LORTAB PO Oral Take by mouth.    . AMLODIPINE BESYLATE 5 MG PO TABS Oral Take 5 mg by mouth daily.      . CYCLOSPORINE 100 MG PO CAPS Oral Take 150 mg by mouth 2 (two) times daily.    Marland Kitchen DIPHENHYDRAMINE-APAP (SLEEP) 25-500 MG PO TABS Oral Take 1 tablet by mouth at bedtime as needed. For sleep     . EPINEPHRINE 0.3 MG/0.3ML IJ DEVI Intramuscular Inject 0.3 mLs (0.3 mg total) into the muscle as needed. 0.3 Device 1    BP 134/84  Pulse 66  Temp 97.9 F (36.6 C) (Oral)  Resp 20  Ht 5\' 9"  (1.753 m)  Wt 215 lb (97.523 kg)  BMI 31.75 kg/m2  SpO2 100%  Physical Exam  Nursing note and vitals reviewed. Constitutional:       Awake, alert, nontoxic appearance.  HENT:  Head: Atraumatic.  Eyes: Right eye exhibits no  discharge. Left eye exhibits no discharge.  Neck: Neck supple.  Cardiovascular: Normal rate and regular rhythm.   No murmur heard. Pulmonary/Chest: Effort normal and breath sounds normal. No respiratory distress. He has no wheezes. He has no rales. He exhibits no tenderness.  Abdominal: Soft. Bowel sounds are normal. He exhibits no distension and no mass. There is no tenderness. There is no rebound and no guarding.  Genitourinary:       Back shows no CVA tenderness or midline tenderness, there is minimal bilateral paralumbar soft tissue tenderness, testicles are descended and nontender and he has no palpable inguinal hernias  Musculoskeletal: He exhibits no tenderness.       Baseline ROM, no  obvious new focal weakness.  Neurological:       Mental status and motor strength appears baseline for patient and situation.  Skin: No rash noted.  Psychiatric: He has a normal mood and affect.    ED Course  Procedures (including critical care time)   Labs Reviewed  URINALYSIS, ROUTINE W REFLEX MICROSCOPIC   No results found.   1. Abdominal pain       MDM  Pt stable in ED with no significant deterioration in condition.Patient / Family / Caregiver informed of clinical course, understand medical decision-making process, and agree with plan.I doubt any other EMC precluding discharge at this time including, but not necessarily limited to the following:peritonitis.        Hurman Horn, MD 12/16/11 [phone]

## 2011-12-10 NOTE — ED Notes (Signed)
C/o abd pain x 20-30 min PTA-lower back pain, groin pain yesterday-denies n/v/d

## 2011-12-23 ENCOUNTER — Ambulatory Visit: Payer: Medicaid Other | Admitting: Family Medicine

## 2012-01-07 ENCOUNTER — Encounter (HOSPITAL_COMMUNITY): Payer: Self-pay | Admitting: Emergency Medicine

## 2012-01-07 ENCOUNTER — Emergency Department (HOSPITAL_COMMUNITY)
Admission: EM | Admit: 2012-01-07 | Discharge: 2012-01-07 | Disposition: A | Discharge status: Home / Self Care | Payer: Medicaid Other | Attending: Emergency Medicine | Admitting: Emergency Medicine

## 2012-01-07 DIAGNOSIS — Z87891 Personal history of nicotine dependence: Secondary | ICD-10-CM | POA: Insufficient documentation

## 2012-01-07 DIAGNOSIS — M25561 Pain in right knee: Secondary | ICD-10-CM

## 2012-01-07 DIAGNOSIS — M25569 Pain in unspecified knee: Secondary | ICD-10-CM | POA: Insufficient documentation

## 2012-01-07 DIAGNOSIS — I10 Essential (primary) hypertension: Secondary | ICD-10-CM | POA: Insufficient documentation

## 2012-01-07 MED ORDER — TRAMADOL HCL 50 MG PO TABS: 50.0000 mg | ORAL_TABLET | Freq: Four times a day (QID) | ORAL | Status: AC | PRN

## 2012-01-07 NOTE — ED Provider Notes (Signed)
History     CSN: [id number]  Arrival date & time 01/07/12  1813   First MD Initiated Contact with Patient 01/07/12 1933      Chief Complaint  Patient presents with  . Knee Pain    (Consider location/radiation/quality/duration/timing/severity/associated sxs/prior treatment) HPI  Patient presents complaining of right knee pain. Describe pain as a throbbing sensation that is constant for the past month and half. Pain is worsened with walking and improved with rest.  Pain is nonradiating. No associated numbness or swelling noted.  Denies any recent trauma. Does have a hx of ACL repair to L knee in the past and was diagnosed approx 3 months ago with meniscal tear to L knee, currently awaiting surgery from a doctor in [place].  Sts he has been favoring his right leg to walk due to his left knee pain. Denies fever, chills, rash, weakness or numbness   Past Medical History  Diagnosis Date  . Hypertension   . Hives     Past Surgical History  Procedure Date  . Anterior cruciate ligament repair   . Meniscus repair     Family History  Problem Relation Age of Onset  . Hypertension Mother   . Hypertension Father   . Diabetes Neg Hx   . Heart attack Neg Hx   . Hyperlipidemia Neg Hx   . Sudden death Neg Hx     History  Substance Use Topics  . Smoking status: Former Games developer  . Smokeless tobacco: Never Used   Comment: Quit 3 months ago  . Alcohol Use: No      Review of Systems  All other systems reviewed and are negative.    Allergies  Ibuprofen; Lisinopril; Naproxen; and Penicillins  Home Medications   Current Outpatient Rx  Name Route Sig Dispense Refill  . AMLODIPINE BESYLATE 5 MG PO TABS Oral Take 5 mg by mouth daily.      . CYCLOSPORINE 100 MG PO CAPS Oral Take 150 mg by mouth 2 (two) times daily.    Marland Kitchen DIPHENHYDRAMINE-APAP (SLEEP) 25-500 MG PO TABS Oral Take 1 tablet by mouth at bedtime as needed. For sleep     . EPINEPHRINE 0.3 MG/0.3ML IJ DEVI Intramuscular  Inject 0.3 mLs (0.3 mg total) into the muscle as needed. 0.3 Device 1    BP 153/92  Pulse 90  Temp 98.3 F (36.8 C) (Oral)  Resp 16  SpO2 100%  Physical Exam  Nursing note and vitals reviewed. Constitutional: He appears well-developed and well-nourished. No distress.  HENT:  Head: Atraumatic.  Eyes: Conjunctivae are normal.  Neck: Neck supple.  Musculoskeletal: Normal range of motion.       Right hip: Normal.       Left hip: Normal.       Right knee: He exhibits normal range of motion, no swelling, no effusion, no ecchymosis, no deformity, no laceration, no erythema and normal alignment. tenderness found. Patellar tendon tenderness noted. No medial joint line and no lateral joint line tenderness noted.       Left knee: Normal.       Right ankle: Normal.       Cervical back: Normal.       Thoracic back: Normal.       Lumbar back: Normal.       Legs: Neurological: He is alert.  Skin: Skin is warm. No rash noted.  Psychiatric: He has a normal mood and affect.    ED Course  Procedures (including critical care time)  Labs Reviewed - No data to display No results found.   No diagnosis found.  1. R knee pain  MDM  Right knee pain. No significant finding on exam. Able to ambulate without difficulty. I suspect pain is likely due to positional changes from chronic L knee pain.  Will give knee sleeve and f/u referral. Pt voice understanding.     BP 153/92  Pulse 90  Temp 98.3 F (36.8 C) (Oral)  Resp 16  SpO2 100%      Fayrene Helper, PA-C 01/07/12 2001

## 2012-01-07 NOTE — ED Notes (Signed)
Ortho tech paged for application of knee sleeve.  

## 2012-01-07 NOTE — ED Notes (Signed)
Pt c/o R knee pain for one and a half months. Pt denies injury to knee. Pt c/o swelling to knee. Pt ambulatory at triage.

## 2012-01-08 NOTE — ED Provider Notes (Signed)
Medical screening examination/treatment/procedure(s) were performed by non-physician practitioner and as supervising physician I was immediately available for consultation/collaboration.   Banessa Mao Y. Merwyn Hodapp, MD 01/08/12 0012 

## 2012-03-24 ENCOUNTER — Emergency Department (HOSPITAL_BASED_OUTPATIENT_CLINIC_OR_DEPARTMENT_OTHER)
Admission: EM | Admit: 2012-03-24 | Discharge: 2012-03-24 | Disposition: A | Discharge status: Home / Self Care | Payer: Medicaid Other | Attending: Emergency Medicine | Admitting: Emergency Medicine

## 2012-03-24 ENCOUNTER — Encounter (HOSPITAL_BASED_OUTPATIENT_CLINIC_OR_DEPARTMENT_OTHER): Payer: Self-pay | Admitting: *Deleted

## 2012-03-24 DIAGNOSIS — M25569 Pain in unspecified knee: Secondary | ICD-10-CM | POA: Insufficient documentation

## 2012-03-24 DIAGNOSIS — Z8241 Family history of sudden cardiac death: Secondary | ICD-10-CM | POA: Insufficient documentation

## 2012-03-24 DIAGNOSIS — Z880 Allergy status to penicillin: Secondary | ICD-10-CM | POA: Insufficient documentation

## 2012-03-24 DIAGNOSIS — I10 Essential (primary) hypertension: Secondary | ICD-10-CM | POA: Insufficient documentation

## 2012-03-24 DIAGNOSIS — Z8489 Family history of other specified conditions: Secondary | ICD-10-CM | POA: Insufficient documentation

## 2012-03-24 DIAGNOSIS — Z8249 Family history of ischemic heart disease and other diseases of the circulatory system: Secondary | ICD-10-CM | POA: Insufficient documentation

## 2012-03-24 DIAGNOSIS — Z833 Family history of diabetes mellitus: Secondary | ICD-10-CM | POA: Insufficient documentation

## 2012-03-24 DIAGNOSIS — M25562 Pain in left knee: Secondary | ICD-10-CM

## 2012-03-24 DIAGNOSIS — Z888 Allergy status to other drugs, medicaments and biological substances status: Secondary | ICD-10-CM | POA: Insufficient documentation

## 2012-03-24 DIAGNOSIS — Z87891 Personal history of nicotine dependence: Secondary | ICD-10-CM | POA: Insufficient documentation

## 2012-03-24 NOTE — ED Provider Notes (Signed)
History     CSN: [id number]  Arrival date & time 03/24/12  4098   First MD Initiated Contact with Patient 03/24/12 [phone]      Chief Complaint  Patient presents with  . Knee Pain     Patient is a 31 y.o. male presenting with knee pain. The history is provided by the patient.  Knee Pain This is a recurrent problem. The current episode started 2 days ago. The problem occurs constantly. The problem has been gradually worsening. Pertinent negatives include no chest pain and no abdominal pain. The symptoms are aggravated by standing. The symptoms are relieved by rest.  pt reports long h/o knee pain, reports previous meniscal repair to left knee Reports he has been working out lately and may have strained too hard No trauma to knee No fever/chills/weakness reported No LE edema reported  Past Medical History  Diagnosis Date  . Hypertension   . Hives     Past Surgical History  Procedure Date  . Anterior cruciate ligament repair   . Meniscus repair     Family History  Problem Relation Age of Onset  . Hypertension Mother   . Hypertension Father   . Diabetes Neg Hx   . Heart attack Neg Hx   . Hyperlipidemia Neg Hx   . Sudden death Neg Hx     History  Substance Use Topics  . Smoking status: Former Games developer  . Smokeless tobacco: Never Used   Comment: Quit 3 months ago  . Alcohol Use: No      Review of Systems  Cardiovascular: Negative for chest pain.  Gastrointestinal: Negative for abdominal pain.    Allergies  Ibuprofen; Lisinopril; Naproxen; and Penicillins  Home Medications   Current Outpatient Rx  Name Route Sig Dispense Refill  . AMLODIPINE BESYLATE 5 MG PO TABS Oral Take 5 mg by mouth daily.      . CYCLOSPORINE 100 MG PO CAPS Oral Take 150 mg by mouth 2 (two) times daily.    Marland Kitchen DIPHENHYDRAMINE-APAP (SLEEP) 25-500 MG PO TABS Oral Take 1 tablet by mouth at bedtime as needed. For sleep     . EPINEPHRINE 0.3 MG/0.3ML IJ DEVI Intramuscular Inject 0.3 mLs (0.3 mg  total) into the muscle as needed. 0.3 Device 1    BP 160/92  Pulse 103  Temp 98.2 F (36.8 C) (Oral)  Resp 20  Ht 5\' 9"  (1.753 m)  Wt 225 lb (102.059 kg)  BMI 33.23 kg/m2  SpO2 100%  Physical Exam CONSTITUTIONAL: Well developed/well nourished HEAD AND FACE: Normocephalic/atraumatic EYES: EOMI ENMT: Mucous membranes moist NECK: supple no meningeal signs SPINE:entire spine nontender CV: S1/S2 noted, no murmurs/rubs/gallops noted LUNGS: Lungs are clear to auscultation bilaterally, no apparent distress ABDOMEN: soft, nontender, no rebound or guarding GU:no cva tenderness NEURO: Pt is awake/alert, moves all extremitiesx4 EXTREMITIES: pulses normal, full ROM, left knee effusion noted but no erythema/crepitance noted.  No left calf tenderness.  No left ankle tenderness noted SKIN: warm, color normal PSYCH: no abnormalities of mood noted  ED Course  Procedures  1. Left knee pain    Advised to use crutches for next week and referral to orthopedist.  He reports chronic knee pain with recent worsening after working out.  Doubt septic joint.  Doubt DVT   MDM  Nursing notes including past medical history and social history reviewed and considered in documentation Previous records reviewed and considered Narcotic database reviewed        Joya Gaskins, MD 03/24/12 [phone]

## 2012-03-24 NOTE — ED Notes (Signed)
Patient  C/O L knee pain for 1.5 weeks, swollen, hurts to ambulate. States he was at the gym working out and it started hurting more afterwards

## 2012-09-01 ENCOUNTER — Telehealth (HOSPITAL_COMMUNITY): Payer: Self-pay | Admitting: Emergency Medicine

## 2012-09-01 ENCOUNTER — Encounter (HOSPITAL_BASED_OUTPATIENT_CLINIC_OR_DEPARTMENT_OTHER): Payer: Self-pay | Admitting: *Deleted

## 2012-09-01 ENCOUNTER — Emergency Department (HOSPITAL_BASED_OUTPATIENT_CLINIC_OR_DEPARTMENT_OTHER)
Admission: EM | Admit: 2012-09-01 | Discharge: 2012-09-01 | Disposition: A | Discharge status: Home / Self Care | Payer: Medicaid Other | Attending: Emergency Medicine | Admitting: Emergency Medicine

## 2012-09-01 DIAGNOSIS — I10 Essential (primary) hypertension: Secondary | ICD-10-CM | POA: Insufficient documentation

## 2012-09-01 DIAGNOSIS — M5431 Sciatica, right side: Secondary | ICD-10-CM

## 2012-09-01 DIAGNOSIS — Z79899 Other long term (current) drug therapy: Secondary | ICD-10-CM | POA: Insufficient documentation

## 2012-09-01 DIAGNOSIS — Z87891 Personal history of nicotine dependence: Secondary | ICD-10-CM | POA: Insufficient documentation

## 2012-09-01 DIAGNOSIS — M5430 Sciatica, unspecified side: Secondary | ICD-10-CM | POA: Insufficient documentation

## 2012-09-01 MED ORDER — CYCLOBENZAPRINE HCL 10 MG PO TABS: 10.0000 mg | ORAL_TABLET | Freq: Three times a day (TID) | ORAL | Status: DC | PRN

## 2012-09-01 MED ORDER — HYDROCODONE-ACETAMINOPHEN 5-325 MG PO TABS: 1.0000 | ORAL_TABLET | ORAL | Status: DC | PRN

## 2012-09-01 MED ORDER — PREDNISONE 20 MG PO TABS: ORAL_TABLET | ORAL | Status: DC

## 2012-09-01 NOTE — ED Notes (Signed)
Prescription(s) not signed.  Pharmacy calling to verify prescription.  Rx for Norco/Vicodin verified

## 2012-09-01 NOTE — ED Notes (Signed)
Pt amb to room 9 with quick steady gait in nad. Pt reports one month of back pain, states he jumped while playing basketball a month ago and felt a pulling in his low back, has had intermittent pain since then, now pain "is like a shock" and radiates to his right buttock and right leg.

## 2012-09-01 NOTE — ED Provider Notes (Signed)
History     CSN: [id number]  Arrival date & time 09/01/12  4098   First MD Initiated Contact with Patient 09/01/12 0827      Chief Complaint  Patient presents with  . Back Pain    (Consider location/radiation/quality/duration/timing/severity/associated sxs/prior treatment) HPI Comments: Patient is a 32 year old man who has had back pain in the lower back for one month. The pain radiates from his lower back into the right buttock and right lateral thigh. He has tried stretching and took Tylenol without relief he also used a heating pad. He hasn't had any prior back problems. He relates this to an episode where he was shooting basketball, went up for a jump shot and came down, and felt something pull in his back.  Patient is a 32 y.o. male presenting with back pain. The history is provided by the patient and medical records. No language interpreter was used.  Back Pain Location:  Lumbar spine Quality:  Aching Radiates to:  R posterior upper leg Pain severity:  Moderate Onset quality:  Gradual Timing:  Intermittent Progression:  Unchanged Chronicity:  New Context comment:  He relates this pain to an injury playing basketball. Relieved by:  Nothing Worsened by:  Nothing tried Ineffective treatments:  OTC medications Associated symptoms: no fever     Past Medical History  Diagnosis Date  . Hypertension   . Hives     Past Surgical History  Procedure Laterality Date  . Anterior cruciate ligament repair    . Meniscus repair      Family History  Problem Relation Age of Onset  . Hypertension Mother   . Hypertension Father   . Diabetes Neg Hx   . Heart attack Neg Hx   . Hyperlipidemia Neg Hx   . Sudden death Neg Hx     History  Substance Use Topics  . Smoking status: Former Games developer  . Smokeless tobacco: Never Used     Comment: Quit 3 months ago  . Alcohol Use: No      Review of Systems  Constitutional: Negative for fever and chills.  HENT: Negative.   Eyes:  Negative.   Respiratory: Negative.   Cardiovascular: Negative.   Gastrointestinal: Negative.   Genitourinary: Negative.   Musculoskeletal: Positive for back pain.  Skin: Negative.   Psychiatric/Behavioral: Negative.     Allergies  Ibuprofen; Lisinopril; Naproxen; and Penicillins  Home Medications   Current Outpatient Rx  Name  Route  Sig  Dispense  Refill  . sertraline (ZOLOFT) 50 MG tablet   Oral   Take 50 mg by mouth daily.         Marland Kitchen amLODipine (NORVASC) 5 MG tablet   Oral   Take 5 mg by mouth daily.           . cycloSPORINE (SANDIMMUNE) 100 MG capsule   Oral   Take 150 mg by mouth 2 (two) times daily.         . diphenhydramine-acetaminophen (TYLENOL PM) 25-500 MG TABS   Oral   Take 1 tablet by mouth at bedtime as needed. For sleep          . EPINEPHrine (EPIPEN) 0.3 mg/0.3 mL DEVI   Intramuscular   Inject 0.3 mLs (0.3 mg total) into the muscle as needed.   0.3 Device   1     BP 145/87  Pulse 73  Temp(Src) 98.2 F (36.8 C) (Oral)  Resp 18  Ht 5\' 8"  (1.727 m)  Wt 225 lb (102.059 kg)  BMI 34.22 kg/m2  SpO2 100%  Physical Exam  Nursing note and vitals reviewed. Constitutional: He is oriented to person, place, and time.  Young man in no distress at rest.  HENT:  Head: Normocephalic and atraumatic.  Right Ear: External ear normal.  Left Ear: External ear normal.  Mouth/Throat: Oropharynx is clear and moist.  Eyes: Conjunctivae and EOM are normal. Pupils are equal, round, and reactive to light.  Neck: Normal range of motion. Neck supple.  Cardiovascular: Normal rate, regular rhythm and normal heart sounds.   Pulmonary/Chest: Effort normal.  Abdominal: Soft. Bowel sounds are normal.  Musculoskeletal:  He localizes pain to the right lower lumbar region, with radiation into the right buttock and right lateral thigh. There is no palpable bony deformity or point tenderness.  Neurological: He is alert and oriented to person, place, and time.  No  sensory or motor deficit.  Skin: Skin is warm and dry.  Psychiatric: He has a normal mood and affect. His behavior is normal.    ED Course  Procedures (including critical care time)  DISP: Rx with Flexeril and prednisone for sciatica, hydrocodone-acetaminophen for pain.  F/U with Dr. Pearletha Forge if not improving in a week.    1. Sciatica of right side         Carleene Cooper III, MD 09/01/12 [phone]

## 2012-12-13 ENCOUNTER — Encounter (HOSPITAL_BASED_OUTPATIENT_CLINIC_OR_DEPARTMENT_OTHER): Payer: Self-pay | Admitting: *Deleted

## 2012-12-13 ENCOUNTER — Emergency Department (HOSPITAL_BASED_OUTPATIENT_CLINIC_OR_DEPARTMENT_OTHER): Payer: Medicaid Other

## 2012-12-13 ENCOUNTER — Emergency Department (HOSPITAL_BASED_OUTPATIENT_CLINIC_OR_DEPARTMENT_OTHER)
Admission: EM | Admit: 2012-12-13 | Discharge: 2012-12-13 | Disposition: A | Discharge status: Home / Self Care | Payer: Medicaid Other | Attending: Emergency Medicine | Admitting: Emergency Medicine

## 2012-12-13 DIAGNOSIS — S335XXA Sprain of ligaments of lumbar spine, initial encounter: Secondary | ICD-10-CM | POA: Insufficient documentation

## 2012-12-13 DIAGNOSIS — Z79899 Other long term (current) drug therapy: Secondary | ICD-10-CM | POA: Insufficient documentation

## 2012-12-13 DIAGNOSIS — IMO0002 Reserved for concepts with insufficient information to code with codable children: Secondary | ICD-10-CM | POA: Insufficient documentation

## 2012-12-13 DIAGNOSIS — Y9389 Activity, other specified: Secondary | ICD-10-CM | POA: Insufficient documentation

## 2012-12-13 DIAGNOSIS — S0993XA Unspecified injury of face, initial encounter: Secondary | ICD-10-CM | POA: Insufficient documentation

## 2012-12-13 DIAGNOSIS — Z872 Personal history of diseases of the skin and subcutaneous tissue: Secondary | ICD-10-CM | POA: Insufficient documentation

## 2012-12-13 DIAGNOSIS — S39012A Strain of muscle, fascia and tendon of lower back, initial encounter: Secondary | ICD-10-CM

## 2012-12-13 DIAGNOSIS — S199XXA Unspecified injury of neck, initial encounter: Secondary | ICD-10-CM | POA: Insufficient documentation

## 2012-12-13 DIAGNOSIS — I10 Essential (primary) hypertension: Secondary | ICD-10-CM | POA: Insufficient documentation

## 2012-12-13 DIAGNOSIS — Y92410 Unspecified street and highway as the place of occurrence of the external cause: Secondary | ICD-10-CM | POA: Insufficient documentation

## 2012-12-13 DIAGNOSIS — Z87891 Personal history of nicotine dependence: Secondary | ICD-10-CM | POA: Insufficient documentation

## 2012-12-13 DIAGNOSIS — Z880 Allergy status to penicillin: Secondary | ICD-10-CM | POA: Insufficient documentation

## 2012-12-13 MED ORDER — CYCLOBENZAPRINE HCL 10 MG PO TABS: 10.0000 mg | ORAL_TABLET | Freq: Three times a day (TID) | ORAL | Status: DC | PRN
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
## 2013-06-11 ENCOUNTER — Encounter (HOSPITAL_COMMUNITY): Payer: Self-pay | Admitting: Emergency Medicine

## 2013-06-11 ENCOUNTER — Emergency Department (HOSPITAL_COMMUNITY)
Admission: EM | Admit: 2013-06-11 | Discharge: 2013-06-11 | Disposition: A | Discharge status: Home / Self Care | Payer: Medicaid Other | Attending: Emergency Medicine | Admitting: Emergency Medicine

## 2013-06-11 DIAGNOSIS — J069 Acute upper respiratory infection, unspecified: Secondary | ICD-10-CM | POA: Insufficient documentation

## 2013-06-11 DIAGNOSIS — Z87891 Personal history of nicotine dependence: Secondary | ICD-10-CM | POA: Insufficient documentation

## 2013-06-11 DIAGNOSIS — J029 Acute pharyngitis, unspecified: Secondary | ICD-10-CM | POA: Insufficient documentation

## 2013-06-11 DIAGNOSIS — Z872 Personal history of diseases of the skin and subcutaneous tissue: Secondary | ICD-10-CM | POA: Insufficient documentation

## 2013-06-11 DIAGNOSIS — IMO0002 Reserved for concepts with insufficient information to code with codable children: Secondary | ICD-10-CM | POA: Insufficient documentation

## 2013-06-11 DIAGNOSIS — Z880 Allergy status to penicillin: Secondary | ICD-10-CM | POA: Insufficient documentation

## 2013-06-11 DIAGNOSIS — Z79899 Other long term (current) drug therapy: Secondary | ICD-10-CM | POA: Insufficient documentation

## 2013-06-11 DIAGNOSIS — I10 Essential (primary) hypertension: Secondary | ICD-10-CM | POA: Insufficient documentation

## 2013-06-11 LAB — RAPID STREP SCREEN (MED CTR MEBANE ONLY): STREPTOCOCCUS, GROUP A SCREEN (DIRECT): NEGATIVE

## 2013-06-11 MED ORDER — GUAIFENESIN-CODEINE 100-10 MG/5ML PO SOLN: 5.0000 mL | Freq: Three times a day (TID) | ORAL | Status: DC | PRN

## 2013-06-11 NOTE — Discharge Instructions (Signed)
Cool Mist Vaporizers Vaporizers may help relieve the symptoms of a cough and cold. They add moisture to the air, which helps mucus to become thinner and less sticky. This makes it easier to breathe and cough up secretions. Cool mist vaporizers do not cause serious burns like hot mist vaporizers ("steamers, humidifiers"). Vaporizers have not been proved to show they help with colds. You should not use a vaporizer if you are allergic to mold.  HOME CARE INSTRUCTIONS  Follow the package instructions for the vaporizer.  Do not use anything other than distilled water in the vaporizer.  Do not run the vaporizer all of the time. This can cause mold or bacteria to grow in the vaporizer.  Clean the vaporizer after each time it is used.  Clean and dry the vaporizer well before storing it.  Stop using the vaporizer if worsening respiratory symptoms develop. Document Released: 02/22/2004 Document Revised: 01/27/2013 Document Reviewed: 10/14/2012 [facility] Patient Information 2014 [place], [place].  Upper Respiratory Infection, Adult An upper respiratory infection (URI) is also sometimes known as the common cold. The upper respiratory tract includes the nose, sinuses, throat, trachea, and bronchi. Bronchi are the airways leading to the lungs. Most people improve within 1 week, but symptoms can last up to 2 weeks. A residual cough may last even longer.  CAUSES Many different viruses can infect the tissues lining the upper respiratory tract. The tissues become irritated and inflamed and often become very moist. Mucus production is also common. A cold is contagious. You can easily spread the virus to others by oral contact. This includes kissing, sharing a glass, coughing, or sneezing. Touching your mouth or nose and then touching a surface, which is then touched by another person, can also spread the virus. SYMPTOMS  Symptoms typically develop 1 to 3 days after you come in contact with a cold virus. Symptoms  vary from person to person. They may include:  Runny nose.  Sneezing.  Nasal congestion.  Sinus irritation.  Sore throat.  Loss of voice (laryngitis).  Cough.  Fatigue.  Muscle aches.  Loss of appetite.  Headache.  Low-grade fever. DIAGNOSIS  You might diagnose your own cold based on familiar symptoms, since most people get a cold 2 to 3 times a year. Your caregiver can confirm this based on your exam. Most importantly, your caregiver can check that your symptoms are not due to another disease such as strep throat, sinusitis, pneumonia, asthma, or epiglottitis. Blood tests, throat tests, and X-rays are not necessary to diagnose a common cold, but they may sometimes be helpful in excluding other more serious diseases. Your caregiver will decide if any further tests are required. RISKS AND COMPLICATIONS  You may be at risk for a more severe case of the common cold if you smoke cigarettes, have chronic heart disease (such as heart failure) or lung disease (such as asthma), or if you have a weakened immune system. The very young and very old are also at risk for more serious infections. Bacterial sinusitis, middle ear infections, and bacterial pneumonia can complicate the common cold. The common cold can worsen asthma and chronic obstructive pulmonary disease (COPD). Sometimes, these complications can require emergency medical care and may be life-threatening. PREVENTION  The best way to protect against getting a cold is to practice good hygiene. Avoid oral or hand contact with people with cold symptoms. Wash your hands often if contact occurs. There is no clear evidence that vitamin C, vitamin E, echinacea, or exercise reduces the  chance of developing a cold. However, it is always recommended to get plenty of rest and practice good nutrition. TREATMENT  Treatment is directed at relieving symptoms. There is no cure. Antibiotics are not effective, because the infection is caused by a  virus, not by bacteria. Treatment may include:  Increased fluid intake. Sports drinks offer valuable electrolytes, sugars, and fluids.  Breathing heated mist or steam (vaporizer or shower).  Eating chicken soup or other clear broths, and maintaining good nutrition.  Getting plenty of rest.  Using gargles or lozenges for comfort.  Controlling fevers with ibuprofen or acetaminophen as directed by your caregiver.  Increasing usage of your inhaler if you have asthma. Zinc gel and zinc lozenges, taken in the first 24 hours of the common cold, can shorten the duration and lessen the severity of symptoms. Pain medicines may help with fever, muscle aches, and throat pain. A variety of non-prescription medicines are available to treat congestion and runny nose. Your caregiver can make recommendations and may suggest nasal or lung inhalers for other symptoms.  HOME CARE INSTRUCTIONS   Only take over-the-counter or prescription medicines for pain, discomfort, or fever as directed by your caregiver.  Use a warm mist humidifier or inhale steam from a shower to increase air moisture. This may keep secretions moist and make it easier to breathe.  Drink enough water and fluids to keep your urine clear or pale yellow.  Rest as needed.  Return to work when your temperature has returned to normal or as your caregiver advises. You may need to stay home longer to avoid infecting others. You can also use a face mask and careful hand washing to prevent spread of the virus. SEEK MEDICAL CARE IF:   After the first few days, you feel you are getting worse rather than better.  You need your caregiver's advice about medicines to control symptoms.  You develop chills, worsening shortness of breath, or brown or red sputum. These may be signs of pneumonia.  You develop yellow or brown nasal discharge or pain in the face, especially when you bend forward. These may be signs of sinusitis.  You develop a fever,  swollen neck glands, pain with swallowing, or white areas in the back of your throat. These may be signs of strep throat. SEEK IMMEDIATE MEDICAL CARE IF:   You have a fever.  You develop severe or persistent headache, ear pain, sinus pain, or chest pain.  You develop wheezing, a prolonged cough, cough up blood, or have a change in your usual mucus (if you have chronic lung disease).  You develop sore muscles or a stiff neck. Document Released: 11/20/2000 Document Revised: 08/19/2011 Document Reviewed: 09/28/2010 [facility] Patient Information 2014 [place]. Sore Throat A sore throat is pain, burning, irritation, or scratchiness of the throat. There is often pain or tenderness when swallowing or talking. A sore throat may be accompanied by other symptoms, such as coughing, sneezing, fever, and swollen neck glands. A sore throat is often the first sign of another sickness, such as a cold, flu, strep throat, or mononucleosis (commonly known as mono). Most sore throats go away without medical treatment. CAUSES  The most common causes of a sore throat include:  A viral infection, such as a cold, flu, or mono.  A bacterial infection, such as strep throat, tonsillitis, or whooping cough.  Seasonal allergies.  Dryness in the air.  Irritants, such as smoke or pollution.  Gastroesophageal reflux disease (GERD). HOME CARE INSTRUCTIONS  Only take over-the-counter medicines as directed by your caregiver.  Drink enough fluids to keep your urine clear or pale yellow.  Rest as needed.  Try using throat sprays, lozenges, or sucking on hard candy to ease any pain (if older than 4 years or as directed).  Sip warm liquids, such as broth, herbal tea, or warm water with honey to relieve pain temporarily. You may also eat or drink cold or frozen liquids such as frozen ice pops.  Gargle with salt water (mix 1 tsp salt with 8 oz of water).  Do not smoke and avoid secondhand smoke.  Put a  cool-mist humidifier in your bedroom at night to moisten the air. You can also turn on a hot shower and sit in the bathroom with the door closed for 5 10 minutes. SEEK IMMEDIATE MEDICAL CARE IF:  You have difficulty breathing.  You are unable to swallow fluids, soft foods, or your saliva.  You have increased swelling in the throat.  Your sore throat does not get better in 7 days.  You have nausea and vomiting.  You have a fever or persistent symptoms for more than 2 3 days.  You have a fever and your symptoms suddenly get worse. MAKE SURE YOU:   Understand these instructions.  Will watch your condition.  Will get help right away if you are not doing well or get worse. Document Released: 07/04/2004 Document Revised: 05/13/2012 Document Reviewed: 02/02/2012 [facility] Patient Information 2014 [place], [place].

## 2013-06-11 NOTE — ED Notes (Signed)
Pt reports 5 day hx of productive cough, sore throat. Secretion thick, blood tinged. Pt took OTC, nyquil for sx. No decrease in sx.

## 2013-06-11 NOTE — ED Provider Notes (Signed)
CSN: [id number]     Arrival date & time 06/11/13  1038 History   First MD Initiated Contact with Patient 06/11/13 1047     Chief Complaint  Patient presents with  . Sore Throat  . Cough    productive cough   (Consider location/radiation/quality/duration/timing/severity/associated sxs/prior Treatment) HPI  Patient presents to the emergency department with complaints of sore throat and cough for the past 5 days. He also endorses having mild chills. He describes his coughing is intermittent and mild but with thick mucus and flecks of blood in it. He has not had fevers, ear pain, nausea, vomiting, diarrhea, bodyaches. He is otherwise healthy. He has been able to eat and drink. The pain is bilateral.   Past Medical History  Diagnosis Date  . Hypertension   . Hives    Past Surgical History  Procedure Laterality Date  . Anterior cruciate ligament repair    . Meniscus repair     Family History  Problem Relation Age of Onset  . Hypertension Mother   . Hypertension Father   . Diabetes Neg Hx   . Heart attack Neg Hx   . Hyperlipidemia Neg Hx   . Sudden death Neg Hx    History  Substance Use Topics  . Smoking status: Former Games developermoker  . Smokeless tobacco: Never Used     Comment: Quit 3 months ago  . Alcohol Use: No    Review of Systems The patient denies anorexia, fever, weight loss,, vision loss, decreased hearing, hoarseness, chest pain, syncope, dyspnea on exertion, peripheral edema, balance deficits, hemoptysis, abdominal pain, melena, hematochezia, severe indigestion/heartburn, hematuria, incontinence, genital sores, muscle weakness, suspicious skin lesions, transient blindness, difficulty walking, depression, unusual weight change, abnormal bleeding, enlarged lymph nodes, angioedema, and breast masses.  Allergies  Ibuprofen; Lisinopril; Naproxen; and Penicillins  Home Medications   Current Outpatient Rx  Name  Route  Sig  Dispense  Refill  . cyclobenzaprine (FLEXERIL) 10 MG  tablet   Oral   Take 1 tablet (10 mg total) by mouth 3 (three) times daily as needed for muscle spasms.   15 tablet   0   . cycloSPORINE (SANDIMMUNE) 100 MG capsule   Oral   Take 150 mg by mouth 2 (two) times daily.         Marland Kitchen. HYDROcodone-acetaminophen (NORCO/VICODIN) 5-325 MG per tablet   Oral   Take 1 tablet by mouth every 4 (four) hours as needed for pain.   20 tablet   0   . predniSONE (DELTASONE) 20 MG tablet      Take 3 tablets per day for 2 days, then take 2 tablets per day for 2 days,then take 1 tablet per day for 2 days.   12 tablet   0   . sertraline (ZOLOFT) 50 MG tablet   Oral   Take 50 mg by mouth daily.         Marland Kitchen. EPINEPHrine (EPIPEN) 0.3 mg/0.3 mL DEVI   Intramuscular   Inject 0.3 mLs (0.3 mg total) into the muscle as needed.   0.3 Device   1   . guaiFENesin-codeine 100-10 MG/5ML syrup   Oral   Take 5-10 mLs by mouth 3 (three) times daily as needed for cough.   120 mL   0    BP 156/101  Pulse 93  Temp(Src) 98.4 F (36.9 C) (Oral)  Resp 18  SpO2 98% Physical Exam  Nursing note and vitals reviewed. Constitutional: He is oriented to person, place, and time. He  appears well-developed and well-nourished. No distress.  HENT:  Head: Normocephalic and atraumatic.  Right Ear: Tympanic membrane, external ear and ear canal normal.  Left Ear: Tympanic membrane, external ear and ear canal normal.  Nose: Nose normal. No rhinorrhea. Right sinus exhibits no maxillary sinus tenderness and no frontal sinus tenderness. Left sinus exhibits no maxillary sinus tenderness and no frontal sinus tenderness.  Mouth/Throat: Uvula is midline and mucous membranes are normal. No trismus in the jaw. Normal dentition. No dental abscesses or uvula swelling. Posterior oropharyngeal erythema present. No oropharyngeal exudate, posterior oropharyngeal edema or tonsillar abscesses.  No submental edema, tongue not elevated, no trismus. No impending airway obstruction; Pt able to speak  full sentences, swallow intact, no drooling, stridor, or tonsillar/uvula displacement. No palatal petechia  Eyes: Conjunctivae are normal.  Neck: Trachea normal, normal range of motion and full passive range of motion without pain. Neck supple. No rigidity. Normal range of motion present. No Brudzinski's sign noted.  Flexion and extension of neck without pain or difficulty. Able to breath without difficulty in extension.  Cardiovascular: Normal rate and regular rhythm.   Pulmonary/Chest: Effort normal and breath sounds normal. No stridor. No respiratory distress. He has no wheezes.  Abdominal: Soft. There is no tenderness.  No obvious evidence of splenomegaly. Non ttp.   Musculoskeletal: Normal range of motion.  Lymphadenopathy:       Head (right side): No preauricular and no posterior auricular adenopathy present.       Head (left side): No preauricular and no posterior auricular adenopathy present.    He has cervical adenopathy.  Neurological: He is alert and oriented to person, place, and time.  Skin: Skin is warm and dry. No rash noted. He is not diaphoretic.  Psychiatric: He has a normal mood and affect.    ED Course  Procedures (including critical care time) Labs Review Labs Reviewed  RAPID STREP SCREEN  CULTURE, GROUP A STREP   Imaging Review No results found.  EKG Interpretation   None       MDM   1. URI (upper respiratory infection)    Negative strep screen. Patient symptoms are consistent with viral syndrome. He's not having fevers or signs or symptoms of peritonsillar abscess. Will treat him symptomatically advising him to drink lots of fluid get plenty of rest. We'll prescribe guaifenesin with codeine for cough and pain.  32 y.o.Jesse Miranda's evaluation in the Emergency Department is complete. It has been determined that no acute conditions requiring further emergency intervention are present at this time. The patient/guardian have been advised of the diagnosis  and plan. We have discussed signs and symptoms that warrant return to the ED, such as changes or worsening in symptoms.  Vital signs are stable at discharge. Filed Vitals:   06/11/13 1048  BP: 156/101  Pulse: 93  Temp: 98.4 F (36.9 C)  Resp: 18    Patient/guardian has voiced understanding and agreed to follow-up with the PCP or specialist.     Dorthula Matas, PA-C 06/11/13 1124

## 2013-06-11 NOTE — ED Provider Notes (Signed)
Medical screening examination/treatment/procedure(s) were performed by non-physician practitioner and as supervising physician I was immediately available for consultation/collaboration.  EKG Interpretation   None      \  Candyce ChurnJohn David Kelsea Mousel, MD 06/11/13 [phone]

## 2013-06-13 LAB — CULTURE, GROUP A STREP

## 2013-08-14 ENCOUNTER — Emergency Department (HOSPITAL_COMMUNITY)
Admission: EM | Admit: 2013-08-14 | Discharge: 2013-08-14 | Disposition: A | Discharge status: Home / Self Care | Payer: Medicaid Other | Attending: Emergency Medicine | Admitting: Emergency Medicine

## 2013-08-14 ENCOUNTER — Encounter (HOSPITAL_COMMUNITY): Payer: Self-pay | Admitting: Emergency Medicine

## 2013-08-14 ENCOUNTER — Emergency Department (HOSPITAL_COMMUNITY): Payer: Medicaid Other

## 2013-08-14 DIAGNOSIS — Z888 Allergy status to other drugs, medicaments and biological substances status: Secondary | ICD-10-CM | POA: Insufficient documentation

## 2013-08-14 DIAGNOSIS — Y92009 Unspecified place in unspecified non-institutional (private) residence as the place of occurrence of the external cause: Secondary | ICD-10-CM | POA: Insufficient documentation

## 2013-08-14 DIAGNOSIS — Z8739 Personal history of other diseases of the musculoskeletal system and connective tissue: Secondary | ICD-10-CM | POA: Insufficient documentation

## 2013-08-14 DIAGNOSIS — I10 Essential (primary) hypertension: Secondary | ICD-10-CM | POA: Insufficient documentation

## 2013-08-14 DIAGNOSIS — Z880 Allergy status to penicillin: Secondary | ICD-10-CM | POA: Insufficient documentation

## 2013-08-14 DIAGNOSIS — X500XXA Overexertion from strenuous movement or load, initial encounter: Secondary | ICD-10-CM | POA: Insufficient documentation

## 2013-08-14 DIAGNOSIS — M545 Low back pain, unspecified: Secondary | ICD-10-CM | POA: Insufficient documentation

## 2013-08-14 DIAGNOSIS — Y9383 Activity, rough housing and horseplay: Secondary | ICD-10-CM | POA: Insufficient documentation

## 2013-08-14 DIAGNOSIS — Z87891 Personal history of nicotine dependence: Secondary | ICD-10-CM | POA: Insufficient documentation

## 2013-08-14 DIAGNOSIS — M549 Dorsalgia, unspecified: Secondary | ICD-10-CM

## 2013-08-14 DIAGNOSIS — Z79899 Other long term (current) drug therapy: Secondary | ICD-10-CM | POA: Insufficient documentation

## 2013-08-14 MED ORDER — METHOCARBAMOL 500 MG PO TABS: 500.0000 mg | ORAL_TABLET | Freq: Two times a day (BID) | ORAL | Status: DC | PRN

## 2013-08-14 NOTE — Discharge Instructions (Signed)
Take the prescribed medication as directed.  May wish to take tylenol with this (can take up to 500mg  4x daily)-- do not exceed more than 2g of tylenol daily. Follow-up with your primary care physician if symptoms persist. Return to the ED for new or worsening symptoms.

## 2013-08-14 NOTE — ED Provider Notes (Signed)
CSN: [id number]     Arrival date & time 08/14/13  1532 History   First MD Initiated Contact with Patient 08/14/13 1549     Chief Complaint  Patient presents with  . Back Pain     (Consider location/radiation/quality/duration/timing/severity/associated sxs/prior Treatment) Patient is a 33 y.o. male presenting with back pain. The history is provided by the patient and medical records.  Back Pain  This is a 33 year old male presenting to the ED for low back pain, onset this morning while playing with his children. States he turned sharply and and felt a pop in his low back.  Pain is localized to the lumbar midline without radiation into the extremities. No loss of bowel or bladder control. No numbness or paresthesias of lower extremities. Patient states he does have a history of lumbar herniated discs. No prior back surgeries. Has taken Tylenol without noted improvement. 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  Past Medical History  Diagnosis Date  . Hypertension   . Hives    Past Surgical History  Procedure Laterality Date  . Anterior cruciate ligament repair    . Meniscus repair     Family History  Problem Relation Age of Onset  . Hypertension Mother   . Hypertension Father   . Diabetes Neg Hx   . Heart attack Neg Hx   . Hyperlipidemia Neg Hx   . Sudden death Neg Hx    History  Substance Use Topics  . Smoking status: Former Games developermoker  . Smokeless tobacco: Never Used     Comment: Quit 3 months ago  . Alcohol Use: No    Review of Systems  Musculoskeletal: Positive for back pain.  All other systems reviewed and are negative.      Allergies  Ibuprofen; Lisinopril; Naproxen; and Penicillins  Home Medications   Current Outpatient Rx  Name  Route  Sig  Dispense  Refill  . cyclobenzaprine (FLEXERIL) 10 MG tablet   Oral   Take 1 tablet (10 mg total) by mouth 3 (three) times daily as needed for muscle spasms.   15 tablet   0   . cycloSPORINE (SANDIMMUNE) 100 MG  capsule   Oral   Take 150 mg by mouth 2 (two) times daily.         Marland Kitchen. EPINEPHrine (EPIPEN) 0.3 mg/0.3 mL DEVI   Intramuscular   Inject 0.3 mLs (0.3 mg total) into the muscle as needed.   0.3 Device   1   . Multiple Vitamin (MULTIVITAMIN WITH MINERALS) TABS tablet   Oral   Take 1 tablet by mouth daily.          BP 145/84  Pulse 110  Temp(Src) 98.2 F (36.8 C) (Oral)  Resp 16  SpO2 100%  Physical Exam  Nursing note and vitals reviewed. Constitutional: He is oriented to person, place, and time. He appears well-developed and well-nourished. No distress.  HENT:  Head: Normocephalic and atraumatic.  Mouth/Throat: Oropharynx is clear and moist.  Eyes: Conjunctivae and EOM are normal. Pupils are equal, round, and reactive to light.  Neck: Normal range of motion.  Cardiovascular: Normal rate, regular rhythm and normal heart sounds.   Pulmonary/Chest: Effort normal and breath sounds normal.  Musculoskeletal: Normal range of motion.       Lumbar back: He exhibits tenderness, bony tenderness and pain. He exhibits normal range of motion, no spasm and normal pulse.       Back:  Tenderness to palpation of lumbar midline; no step-off or gross deformities,  full range of motion maintained; distal sensation intact, ambulating unassisted without difficulty  Neurological: He is alert and oriented to person, place, and time.  Skin: Skin is warm and dry. He is not diaphoretic.  Psychiatric: He has a normal mood and affect.    ED Course  Procedures (including critical care time) Labs Review Labs Reviewed - No data to display Imaging Review Dg Lumbar Spine Complete  08/14/2013   CLINICAL DATA:  Low back pain.  EXAM: LUMBAR SPINE - COMPLETE 4+ VIEW  COMPARISON:  Lumbar spine radiograph 12/13/2012.  FINDINGS: Five views of the lumbar spine demonstrate no acute displaced fracture or compression type fracture. Alignment is anatomic. No defects of the pars interarticularis are noted.  IMPRESSION:  1. No acute radiographic abnormality of the lumbar spine.   Electronically Signed   By: Trudie Reed M.D.   On: 08/14/2013 16:53     EKG Interpretation None      MDM   Final diagnoses:  Back pain   X-ray negative for acute findings. Patient has no signs or symptoms concerning for cauda equina this time. He'll be started on Robaxin, advised to take Tylenol for added relief. Advised on supportive care  Including modifying activity and heat therapy for the next several days. He will follow up with his primary care physician if problems occur.  Discussed plan with pt, they agreed.  Return precautions advised.   Garlon Hatchet, PA-C 08/14/13 1759

## 2013-08-14 NOTE — ED Notes (Signed)
Pt reports that he was playing with his children this morning around 10:00 and "turned his body a certain way" and now has lower back. Pt denies radiation of the pain. Pt is A/O x4, ambulatory to triage without difficulty, and in NAD.

## 2013-08-14 NOTE — ED Provider Notes (Signed)
Medical screening examination/treatment/procedure(s) were performed by non-physician practitioner and as supervising physician I was immediately available for consultation/collaboration.   EKG Interpretation None        Kristen N Ward, DO 08/14/13 2257 

## 2013-12-13 ENCOUNTER — Encounter (HOSPITAL_COMMUNITY): Payer: Self-pay | Admitting: Emergency Medicine

## 2013-12-13 ENCOUNTER — Emergency Department (HOSPITAL_COMMUNITY)
Admission: EM | Admit: 2013-12-13 | Discharge: 2013-12-13 | Disposition: A | Discharge status: Home / Self Care | Payer: Medicaid Other | Attending: Emergency Medicine | Admitting: Emergency Medicine

## 2013-12-13 DIAGNOSIS — H16209 Unspecified keratoconjunctivitis, unspecified eye: Secondary | ICD-10-CM | POA: Diagnosis not present

## 2013-12-13 DIAGNOSIS — Z87891 Personal history of nicotine dependence: Secondary | ICD-10-CM | POA: Insufficient documentation

## 2013-12-13 DIAGNOSIS — Z880 Allergy status to penicillin: Secondary | ICD-10-CM | POA: Diagnosis not present

## 2013-12-13 DIAGNOSIS — H16009 Unspecified corneal ulcer, unspecified eye: Secondary | ICD-10-CM | POA: Insufficient documentation

## 2013-12-13 DIAGNOSIS — Z872 Personal history of diseases of the skin and subcutaneous tissue: Secondary | ICD-10-CM | POA: Insufficient documentation

## 2013-12-13 DIAGNOSIS — H5789 Other specified disorders of eye and adnexa: Secondary | ICD-10-CM

## 2013-12-13 DIAGNOSIS — H16203 Unspecified keratoconjunctivitis, bilateral: Secondary | ICD-10-CM

## 2013-12-13 DIAGNOSIS — H5710 Ocular pain, unspecified eye: Secondary | ICD-10-CM | POA: Diagnosis present

## 2013-12-13 DIAGNOSIS — I10 Essential (primary) hypertension: Secondary | ICD-10-CM | POA: Insufficient documentation

## 2013-12-13 DIAGNOSIS — H16002 Unspecified corneal ulcer, left eye: Secondary | ICD-10-CM

## 2013-12-13 MED ORDER — FLUORESCEIN SODIUM 1 MG OP STRP
2.0000 | ORAL_STRIP | Freq: Once | OPHTHALMIC | Status: AC
  Administered 2013-12-13: 2 via OPHTHALMIC
  Filled 2013-12-13: qty 2

## 2013-12-13 MED ORDER — ERYTHROMYCIN 5 MG/GM OP OINT: 1.0000 "application " | TOPICAL_OINTMENT | Freq: Four times a day (QID) | OPHTHALMIC | Status: DC

## 2013-12-13 MED ORDER — TETRACAINE HCL 0.5 % OP SOLN
1.0000 [drp] | Freq: Once | OPHTHALMIC | Status: AC
  Administered 2013-12-13: 1 [drp] via OPHTHALMIC
  Filled 2013-12-13: qty 2

## 2013-12-13 NOTE — ED Notes (Signed)
Patient c/o of bilateral eye pain with photophobia. States he left his contacts in for too long. He took them out and had pain and discomfort. The pain resolved for @ 1 week and has now returned 12/10/2013. Patient denies seeing his opthalmologist since the incident began. Patient has been using clear eye, eye gtts at home.

## 2013-12-13 NOTE — Discharge Instructions (Signed)
Followup with ophthalmology within 24 hours. Use erythromycin ointment as prescribed. Do not wear your contacts until instructed by an eye specialist.  Keratoconjunctivitis Sicca Keratoconjunctivitis sicca (dry eye) is dryness of the membranes surrounding the eye. It is caused by inadequate production of natural tears. The eyes must remain lubricated at all times. This creates a smooth surface of the cornea (clear covering at the front of the eye), which allows the eyelids to slide over the eye, without causing soreness and irritation. A small amount of tears are constantly produced by the tear glands (lacrimal glands). These glands are located under the outside part of the upper eyelids. Dry eyes are often caused by decreased tear production. This condition is called aqueous tear-deficient dry eyes. The tear glands do not produce enough tears to keep the tissues surrounding the eye moist. This condition is common in postmenopausal women. Dry eyes may also be caused by an abnormality of how the tears are made. This can result in faster evaporation of the tears. It is called evaporative dry eyes. Although the tear glands produce enough tears, the rate of evaporation is so fast that the entire eye surface cannot be kept covered with a complete layer of tears. The condition can occur during certain activities or in unusually dry surroundings.  Sometimes dry eyes are symptoms of other diseases that can affect the eyes. Some examples are:   Rheumatoid arthritis.  Systemic lupus erythematosus (lupus). Chronic inflammatory disease, causing the body to attack its own tissue.  Sjgren's syndrome. Dryness of the eyes and mouth, sometimes associated with a form of rheumatoid arthritis or lupus. SYMPTOMS  Symptoms of dry eyes include:  Irritation.  Itching.  Redness.  Burning and feeling as though there is something gritty in the eye. If the surface of the eye becomes damaged, sensitivity may increase,  along with increased discomfort and sensitivity to bright light. Symptoms are made worse by activities with decreased blinking, such as staring at a television, book, or computer screen.  Symptoms are also worse in dusty or smoky areas and in dry environments. Certain drugs can make symptoms worse, including antihistamines, tranquilizers, diuretics, anti-hypertensives (blood pressure medicine), and oral contraceptives. Symptoms tend to improve during cool, rainy, or foggy weather and in humid places, such as in the shower. DIAGNOSIS  Dry eyes are usually diagnosed by symptoms alone. Sometimes a Schirmer test is done, in which a strip of filter paper is placed at the edge of the eyelid. The amount of moisture bathing the eye is measured. This test is done by measuring how far the tears go up a strip of paper applied to the eye, in a set amount of time. Your caregiver will use a special microscope to examine the surface of the eye and the cornea, to see if there are signs of dryness. TREATMENT  Artificial tears (eye drops made with substances that simulate real tears) applied every few hours can generally control the problem. Lubricating ointments may help more severe cases. Avoiding dry, drafty environments and using humidifiers may also help. Minor surgery can be done to block the flow of tears into the nose, so that more tears are available to bathe the eyes. Patients with evaporative dry eyes may also benefit from treatment of the inflammation (redness and soreness) of the eyelids, which often occurs with the dry eyes. This treatment may include warm compresses, eyelid margin scrubs, or oral medicines. PROGNOSIS  Even with severe dry eyes, it is uncommon to lose vision. At times,  it may become difficult to see. Rarely, scarring and ulcers may occur, and blood vessels can grow across the cornea. Scarring and blood vessel growth can impair vision. In severe cases, the cornea may become damaged or infected.  Ulcers or infections of the cornea are serious complications. PREVENTION  There is no way to prevent getting keratoconjunctivitis sicca. However, complications can be prevented by keeping the eyes as moist as possible with artificial drops, as needed. SEEK IMMEDIATE MEDICAL CARE IF:   Your eye pain gets worse.  You develop a pus-like drainage from the eye.  The eye drops or medicines prescribed by your caregiver are not helping.  You have dry eyes and a sudden increase in discomfort or redness.  You have a sudden decrease in vision.  You have any other questions or concerns. Document Released: 04/13/2004 Document Revised: 08/19/2011 Document Reviewed: 04/17/2009 [facility] Patient Information 2015 [place], [place]. This information is not intended to replace advice given to you by your health care provider. Make sure you discuss any questions you have with your health care provider.  Corneal Ulcer A corneal ulcer is an open sore on the cornea. The cornea is the clear covering at the front and center of the eye.  CAUSES  Most corneal ulcers are caused by infection, but there are other causes as well.  Bacterial infection. A bacterial infection can occur and cause a corneal ulcer if:  Contact lenses are worn too long (especially overnight) or are not properly cared for.  An eye injury occurs, allowing bacteria to infect the area of injury.  Viral infection. A viral infection can occur and cause a corneal ulcer if:  The eye becomes infected with a virus, such as the herpes simplex (cold sore) virus, chickenpox virus, or shingles virus.  Fungal infection. A fungal infection can occur and cause a corneal ulcer if:  An eye injury resulted from contact with a plant or plant material.  An anti-inflammatory eye drop is overused.  You have a weakened immune system.  Contact lenses are improperly cared for or become infected.  Foreign bodies in the eye, such as sand, glass, or small  pieces of glass or metal.  Dry eyes.  Certain disorders that prevent eyelids from closing completely, such as Bell's palsy.  Contact lenses, especially extended-wear soft contact lenses. Contact lenses can:  Scratch the cornea's surface, allowing bacteria to enter the scratch.  Trap dirt underneath the contact lens, which can scratch the cornea.  Harbor bacteria and fungi, making it more likely for bacterial infections to occur.  Block oxygen from the cornea, making it more likely for infections to occur. SYMPTOMS   Eye pain that is often severe.  Blurry vision.  Light sensitivity.  Pus or thick discharge coming from your eye.  Eye redness.  Feeling like something is in your eye.  Watery or itchy eye.  Burning or stinging feeling. Some ulcers that are very big may be seen as a white spot on the cornea. DIAGNOSIS  An eye exam will be performed. Your health care provider may use a special kind of microscope (slit lamp) to look at the cornea. Eye drops may be put into the eye to make the ulcer easier to see. If it is suspected that an infection caused the corneal ulcer, tissue samples or cultures from the eye may be taken. Numbing eye drops will be given before any samples or cultures are taken. The samples or cultures will be examined in the lab to check  for bacteria, viruses, or fungi. TREATMENT  Treatment of the corneal ulcer depends on the cause. If your ulcer is severe, you may be given antibiotic eye drops up until your health care provider knows the test results. Other treatments can include:  Antibacterial, antiviral, or antifungal eye drops or ointment.  Removing the foreign body that caused the eye injury.  Artificial tears or a bandage contact lens if severe dry eyes caused the corneal ulcer.  Over-the-counter or prescription pain medicine.  Steroidal eye drops if the eye is inflamed and swollen.  Antibiotic medicines by mouth.  An injection of medicine under  the thin membrane covering the eyeball (conjunctiva). This allows medicine to reach the ulcer in high doses.  Eye patching to reduce irritation from blinking and bright light. An eye patch may not be given if the ulcer was caused by a bacterial infection. If the corneal ulcer causes a scar on the cornea that interferes with vision, hospitalization and surgery may be needed to replace the cornea (corneal transplant). HOME CARE INSTRUCTIONS   If prescribed, use your antibiotic pills, eye drops, or ointment as directed. Continue using them even if you start to feel better. You may have to apply eye drops as often as every few minutes to every hour, for days. It may be necessary to set your alarm clock every few minutes to every hour during the night. This is absolutely necessary.  Only take over-the-counter or prescription medicines as directed by your health care provider.  Apply artificial tears as needed if you have dry eyes.  Do not touch or rub your eye, because this may increase the irritation and spread the infection.  Avoid wearing eye makeup.  Stay in a dark room and use sunglasses if you have light sensitivity.  Apply cool packs to your eye to relieve discomfort and swelling.  If your eye is patched, you should not drive or use machinery. You will have reduced side vision and ability to judge distance.  Do not drive or operate machinery until approved by your health care provider. Your ability to judge distances is impaired.  Follow up with your health care provider as directed.  Do not wear contact lenses until your health care provider approves. If you normally wear contact lenses, follow these general rules to avoid the risk of a corneal ulcer:  Do not wear contact lenses while you sleep.  Wash your hands before removing contact lenses.  Properly sterilize and store your contact lenses.  Regularly clean your contact lens case.  Do not use your saliva or tap water to  clean or wet your contact lenses.  Remove your contact lenses if your eye becomes irritated. You may put them back in once your eyes feel better. SEEK IMMEDIATE MEDICAL CARE IF:   You notice a change in your vision.  Your pain is getting worse, not better.  You have increasing discharge from the eye. MAKE SURE YOU:   Understand these instructions.  Will watch your condition.  Will get help right away if you are not doing well or get worse. Document Released: 07/04/2004 Document Revised: 01/27/2013 Document Reviewed: 10/27/2012 [facility]ExitCare Patient Information 2015 [place], [place]. This information is not intended to replace advice given to you by your health care provider. Make sure you discuss any questions you have with your health care provider.

## 2013-12-13 NOTE — ED Notes (Signed)
Bed: WTR8 Expected date:  Expected time:  Means of arrival:  Comments: EVS to clean

## 2013-12-13 NOTE — ED Provider Notes (Signed)
CSN: [id number]     Arrival date & time 12/13/13  2125 History  This chart was scribed for non-physician provider Antony MaduraKelly Janya Eveland, PA-C, working with Hurman HornJohn M Bednar, MD by Phillis HaggisGabriella Gaje, ED Scribe. This patient was seen in room WTR1/WLPT1 and patient care was started at 10:01 PM.    Chief Complaint  Patient presents with  . Eye Pain    bilateral, itching, and photophobia, denies drainage   Patient is a 33 y.o. male presenting with eye pain. The history is provided by the patient. No language interpreter was used.  Eye Pain This is a recurrent problem. The current episode started more than 1 week ago. The problem occurs constantly. The problem has been gradually worsening. Nothing relieves the symptoms.   HPI Comments: Jesse Miranda is a 33 y.o. male who presents to the Emergency Department complaining of bilateral eye pain onset 3 weeks ago. He states that he left his contacts in for too long which caused irritation, swelling and redness. He reports that the condition got better, but has since reoccurred 4 days ago. He reports irritation, itching, stinging, photophobia, and blurred vision. He denies fever, crust on the lids and eye discharge. He reports that he has not worn his contacts since the initial irritation. He reports that he has been using Visine for the irritation. He reports that he does not have an opthalmologist and has not seen one for this problem.    Past Medical History  Diagnosis Date  . Hypertension   . Hives    Past Surgical History  Procedure Laterality Date  . Anterior cruciate ligament repair    . Meniscus repair     Family History  Problem Relation Age of Onset  . Hypertension Mother   . Hypertension Father   . Diabetes Neg Hx   . Heart attack Neg Hx   . Hyperlipidemia Neg Hx   . Sudden death Neg Hx    History  Substance Use Topics  . Smoking status: Former Games developermoker  . Smokeless tobacco: Never Used     Comment: Quit 3 months ago  . Alcohol Use: No    Review  of Systems  Constitutional: Negative for fever.  Eyes: Positive for photophobia, pain, redness, itching and visual disturbance. Negative for discharge.  All other systems reviewed and are negative.   Allergies  Ibuprofen; Lisinopril; Naproxen; and Penicillins  Home Medications   Prior to Admission medications   Medication Sig Start Date End Date Taking? Authorizing Provider  cyclobenzaprine (FLEXERIL) 10 MG tablet Take 1 tablet (10 mg total) by mouth 3 (three) times daily as needed for muscle spasms. 12/13/12  Yes Geoffery Lyonsouglas Delo, MD  EPINEPHrine (EPIPEN) 0.3 mg/0.3 mL IJ SOAJ injection Inject 0.3 mg into the muscle as needed (allergic reaction.).   Yes Historical Provider, MD  HYDROcodone-acetaminophen (NORCO) 7.5-325 MG per tablet Take 1 tablet by mouth every 6 (six) hours as needed for moderate pain.   Yes Historical Provider, MD  Hyprom-Naphaz-Polysorb-Zn Sulf (CLEAR EYES COMPLETE OP) Apply 1-2 drops to eye 2 (two) times daily as needed (eye discomfort.).   Yes Historical Provider, MD  erythromycin ophthalmic ointment Place 1 application into the left eye every 6 (six) hours. Place 1/2 inch ribbon of ointment in the affected eye 4 times a day 12/13/13   Antony MaduraKelly Altan Kraai, PA-C   BP 151/97  Pulse 92  Temp(Src) 98.3 F (36.8 C) (Oral)  Resp 16  SpO2 98%  Physical Exam  Nursing note and vitals reviewed. Constitutional: He  is oriented to person, place, and time. He appears well-developed and well-nourished. No distress.  HENT:  Head: Normocephalic and atraumatic.  Eyes: Conjunctivae and EOM are normal. Pupils are equal, round, and reactive to light. Lids are everted and swept, no foreign bodies found. Right conjunctiva is not injected. Right conjunctiva has no hemorrhage. Left conjunctiva is not injected. Left conjunctiva has no hemorrhage. No scleral icterus.  Fundoscopic exam:      The right eye shows no hemorrhage. The right eye shows red reflex.       The left eye shows no hemorrhage. The  left eye shows red reflex.  Slit lamp exam:      The right eye shows no corneal abrasion, no corneal flare, no corneal ulcer, no hyphema and no fluorescein uptake.       The left eye shows corneal ulcer and fluorescein uptake (findings c/w corneal ulcer). The left eye shows no corneal abrasion, no corneal flare and no hyphema.  Snellen 20/25 OU and OD; 20/40 OS  Neck: Normal range of motion.  Pulmonary/Chest: Effort normal. No respiratory distress.  Musculoskeletal: Normal range of motion.  Neurological: He is alert and oriented to person, place, and time.  Skin: Skin is warm and dry. No rash noted. He is not diaphoretic. No erythema. No pallor.  Psychiatric: He has a normal mood and affect. His behavior is normal.    ED Course  Procedures (including critical care time) DIAGNOSTIC STUDIES: Oxygen Saturation is 98% on room air, normal by my interpretation.    COORDINATION OF CARE: 10:07 PM-Discussed treatment plan which includes saline eye drops with pt at bedside and pt agreed to plan.   Labs Review Labs Reviewed - No data to display  Imaging Review No results found.   EKG Interpretation None      MDM   Final diagnoses:  Irritation of both eyes  Keratoconjunctivitis, bilateral  Corneal ulcer of left eye    Patient presents for b/l eye irritation likely secondary to prolonged contact use. No proptosis or hyphema on exam. No corneal abrasion or foreign body. Visual acuity mildly decreased on left. Fluorescein uptake today does reveal corneal ulcer. Cause of ulcer unclear, but c/w hx of prolonged contact use. Will cover for bacterial infection with erythromycin and refer to ophthalmology. Patient stable and appropriate for discharge. Return precautions provided and patient agreeable to plan with no unaddressed concerns.  I personally performed the services described in this documentation, which was scribed in my presence. The recorded information has been reviewed and is  accurate.   Filed Vitals:   12/13/13 2134  BP: 151/97  Pulse: 92  Temp: 98.3 F (36.8 C)  TempSrc: Oral  Resp: 16  SpO2: 98%      Antony MaduraKelly Chester Romero, PA-C 12/20/13 2041

## 2013-12-24 NOTE — ED Provider Notes (Signed)
Medical screening examination/treatment/procedure(s) were performed by non-physician practitioner and as supervising physician I was immediately available for consultation/collaboration.   EKG Interpretation None       Hurman HornJohn M Diala Waxman, MD 12/24/13 [phone]

## 2015-10-28 ENCOUNTER — Encounter (HOSPITAL_COMMUNITY): Payer: Self-pay

## 2015-10-28 ENCOUNTER — Emergency Department (HOSPITAL_COMMUNITY): Payer: Medicaid Other

## 2015-10-28 ENCOUNTER — Emergency Department (HOSPITAL_COMMUNITY)
Admission: EM | Admit: 2015-10-28 | Discharge: 2015-10-28 | Disposition: A | Discharge status: Home / Self Care | Payer: Medicaid Other | Attending: Emergency Medicine | Admitting: Emergency Medicine

## 2015-10-28 DIAGNOSIS — Z87891 Personal history of nicotine dependence: Secondary | ICD-10-CM | POA: Diagnosis not present

## 2015-10-28 DIAGNOSIS — R072 Precordial pain: Secondary | ICD-10-CM | POA: Diagnosis not present

## 2015-10-28 DIAGNOSIS — Z79899 Other long term (current) drug therapy: Secondary | ICD-10-CM | POA: Insufficient documentation

## 2015-10-28 DIAGNOSIS — I10 Essential (primary) hypertension: Secondary | ICD-10-CM | POA: Insufficient documentation

## 2015-10-28 DIAGNOSIS — R079 Chest pain, unspecified: Secondary | ICD-10-CM

## 2015-10-28 LAB — CBC WITH DIFFERENTIAL/PLATELET
BASOS ABS: 0 10*3/uL (ref 0.0–0.1)
Basophils Relative: 0 %
EOS ABS: 0.1 10*3/uL (ref 0.0–0.7)
EOS PCT: 2 %
HCT: 45.1 % (ref 39.0–52.0)
Hemoglobin: 14.9 g/dL (ref 13.0–17.0)
Lymphocytes Relative: 35 %
Lymphs Abs: 1.9 10*3/uL (ref 0.7–4.0)
MCH: 30 pg (ref 26.0–34.0)
MCHC: 33 g/dL (ref 30.0–36.0)
MCV: 90.9 fL (ref 78.0–100.0)
Monocytes Absolute: 0.5 10*3/uL (ref 0.1–1.0)
Monocytes Relative: 9 %
Neutro Abs: 3 10*3/uL (ref 1.7–7.7)
Neutrophils Relative %: 55 %
PLATELETS: 187 10*3/uL (ref 150–400)
RBC: 4.96 MIL/uL (ref 4.22–5.81)
RDW: 12.1 % (ref 11.5–15.5)
WBC: 5.5 10*3/uL (ref 4.0–10.5)

## 2015-10-28 LAB — BASIC METABOLIC PANEL
Anion gap: 8 (ref 5–15)
BUN: 13 mg/dL (ref 6–20)
CALCIUM: 9.2 mg/dL (ref 8.9–10.3)
CO2: 29 mmol/L (ref 22–32)
CREATININE: 1.27 mg/dL — AB (ref 0.61–1.24)
Chloride: 103 mmol/L (ref 101–111)
GFR calc Af Amer: >60 mL/min (ref >60)
GLUCOSE: 96 mg/dL (ref 65–99)
Potassium: 3.8 mmol/L (ref 3.5–5.1)
SODIUM: 140 mmol/L (ref 135–145)

## 2015-10-28 LAB — I-STAT TROPONIN, ED
TROPONIN I, POC: 0 ng/mL (ref 0.00–0.08)
TROPONIN I, POC: 0 ng/mL (ref 0.00–0.08)

## 2015-10-28 NOTE — ED Notes (Signed)
Per EMS: Pt complaining of palpitations and chest pain this evening. Pt states "I felt like my heart was racing." EMS gave 324 ASA, chest pain resolved upon arrival. Pt with hx anxiety.

## 2015-10-28 NOTE — Discharge Instructions (Signed)

## 2015-10-28 NOTE — ED Provider Notes (Signed)
CSN: [id number]     Arrival date & time 10/28/15  0108 History   By signing my name below, I, Arlan Organshley Leger, attest that this documentation has been prepared under the direction and in the presence of Zadie Rhineonald Kailynne Ferrington, MD.  Electronically Signed: Arlan OrganAshley Leger, ED Scribe. 10/28/2015. 1:41 AM.   Chief Complaint  Patient presents with  . Chest Pain   Patient is a 35 y.o. male presenting with chest pain. The history is provided by the patient.  Chest Pain Pain location:  Substernal area Pain quality: throbbing   Pain radiates to:  Does not radiate Pain radiates to the back: no   Pain severity:  Moderate Onset quality:  Sudden Duration:  2 hours Timing:  Intermittent Progression:  Improving Chronicity:  New Relieved by:  None tried Worsened by:  Nothing tried Ineffective treatments:  None tried Associated symptoms: cough   Associated symptoms: no back pain, no dizziness, no fever, no headache, no nausea and not vomiting   Risk factors: hypertension     HPI Comments: Jesse FleetJason Miranda is a 35 y.o. male with a PMHx of HTN who presents to the Emergency Department complaining of sudden onset, intermittent, ongoing chest pain onset 12:00 AM this evening. Pain is described as throbbing. Pt also reports mid cough and shortness of breath. Family states they were getting ready to grab something to eat when he began to grab his chest complaining of pain. No aggravating or alleviating factors at this time. No OTC medications or home remedies attempted prior to arrival. No recent fever, chills, nausea, vomiting, abdominal pain, or leg swelling. Pt occasionally smokes black and milds but denies any illicit drug use. No recent long distance travel. No prior history of same. He denies any family history of heart disease. No hemoptysis  PCP: BONSU, OSEI A, DO    Past Medical History  Diagnosis Date  . Hypertension   . Hives    Past Surgical History  Procedure Laterality Date  . Anterior cruciate  ligament repair    . Meniscus repair     Family History  Problem Relation Age of Onset  . Hypertension Mother   . Hypertension Father   . Diabetes Neg Hx   . Heart attack Neg Hx   . Hyperlipidemia Neg Hx   . Sudden death Neg Hx    Social History  Substance Use Topics  . Smoking status: Former Games developermoker  . Smokeless tobacco: Never Used     Comment: Quit 3 months ago  . Alcohol Use: No    Review of Systems  Constitutional: Negative for fever and chills.  Respiratory: Positive for cough.   Cardiovascular: Positive for chest pain. Negative for leg swelling.  Gastrointestinal: Negative for nausea and vomiting.  Musculoskeletal: Negative for back pain.  Neurological: Negative for dizziness and headaches.  Psychiatric/Behavioral: Negative for confusion.  All other systems reviewed and are negative.     Allergies  Ibuprofen; Lisinopril; Naproxen; and Penicillins  Home Medications   Prior to Admission medications   Medication Sig Start Date End Date Taking? Authorizing Provider  EPINEPHrine (EPIPEN) 0.3 mg/0.3 mL IJ SOAJ injection Inject 0.3 mg into the muscle as needed (allergic reaction.).   Yes Historical Provider, MD  HYDROcodone-acetaminophen (NORCO) 7.5-325 MG per tablet Take 1 tablet by mouth every 6 (six) hours as needed for moderate pain.   Yes Historical Provider, MD  cyclobenzaprine (FLEXERIL) 10 MG tablet Take 1 tablet (10 mg total) by mouth 3 (three) times daily as needed for muscle  spasms. Patient not taking: Reported on 10/28/2015 12/13/12   Geoffery Lyons, MD  erythromycin ophthalmic ointment Place 1 application into the left eye every 6 (six) hours. Place 1/2 inch ribbon of ointment in the affected eye 4 times a day Patient not taking: Reported on 10/28/2015 12/13/13   Antony Madura, PA-C   Triage Vitals: BP 150/87 mmHg  Pulse 64  Temp(Src) 98.7 F (37.1 C) (Oral)  Resp 12  SpO2 99%   Physical Exam  CONSTITUTIONAL: Well developed/well nourished HEAD:  Normocephalic/atraumatic EYES: EOMI/PERRL ENMT: Mucous membranes moist NECK: supple no meningeal signs SPINE/BACK:entire spine nontender CV: S1/S2 noted, no murmurs/rubs/gallops noted LUNGS: Lungs are clear to auscultation bilaterally, no apparent distress ABDOMEN: soft, nontender, no rebound or guarding, bowel sounds noted throughout abdomen GU:no cva tenderness NEURO: Pt is awake/alert/appropriate, moves all extremitiesx4.  No facial droop.   EXTREMITIES: pulses normal/equal, full ROM. No calf tenderness or edema  SKIN: warm, color normal PSYCH: no abnormalities of mood noted, alert and oriented to situation   ED Course  Procedures   DIAGNOSTIC STUDIES: Oxygen Saturation is 99% on RA, Normal by my interpretation.    COORDINATION OF CARE: 1:32 AM- Will order imaging and blood work. Discussed treatment plan with pt at bedside and pt agreed to plan.     Labs Review Labs Reviewed  BASIC METABOLIC PANEL - Abnormal; Notable for the following:    Creatinine, Ser 1.27 (*)    All other components within normal limits  CBC WITH DIFFERENTIAL/PLATELET  I-STAT TROPOININ, ED  Rosezena Sensor, ED    Imaging Review Dg Chest 2 View  10/28/2015  CLINICAL DATA:  35 year old male with shortness of breath and chest pain EXAM: CHEST  2 VIEW COMPARISON:  Chest CT dated 05/18/2011 FINDINGS: The heart size and mediastinal contours are within normal limits. Both lungs are clear. The visualized skeletal structures are unremarkable. IMPRESSION: No active cardiopulmonary disease. Electronically Signed   By: Elgie Collard M.D.   On: 10/28/2015 02:34   I have personally reviewed and evaluated these images and lab results as part of my medical decision-making.   EKG Interpretation   Date/Time:  Saturday Oct 28 2015 01:14:35 EDT Ventricular Rate:  66 PR Interval:  193 QRS Duration: 96 QT Interval:  388 QTC Calculation: 406 R Axis:   44 Text Interpretation:  Sinus rhythm RSR' in V1 or V2,  probably normal  variant ST elev, probable normal early repol pattern No significant change  since last tracing Confirmed by Bebe Shaggy  MD, Beverely Suen ([id number]) on 10/28/2015  1:17:24 AM     HEART score 2 Pt with two negative troponin He is well appearing I doubt ACS He had no pleuritic CP, no pain radiating to back I doubt PE or dissection at this time Will discharge home Discussed return precautions Pt agreeable with plan   MDM   Final diagnoses:  Chest pain, unspecified chest pain type    Nursing notes including past medical history and social history reviewed and considered in documentation xrays/imaging reviewed by myself and considered during evaluation Labs/vital reviewed myself and considered during evaluation   I personally performed the services described in this documentation, which was scribed in my presence. The recorded information has been reviewed and is accurate.       Zadie Rhine, MD 10/28/15 [phone]

## 2017-02-05 DIAGNOSIS — I10 Essential (primary) hypertension: Secondary | ICD-10-CM | POA: Diagnosis not present

## 2017-02-05 DIAGNOSIS — Z87891 Personal history of nicotine dependence: Secondary | ICD-10-CM | POA: Insufficient documentation

## 2017-02-05 DIAGNOSIS — R072 Precordial pain: Secondary | ICD-10-CM | POA: Diagnosis not present

## 2017-02-05 DIAGNOSIS — R079 Chest pain, unspecified: Secondary | ICD-10-CM | POA: Diagnosis present

## 2017-02-06 ENCOUNTER — Emergency Department (HOSPITAL_BASED_OUTPATIENT_CLINIC_OR_DEPARTMENT_OTHER)
Admission: EM | Admit: 2017-02-06 | Discharge: 2017-02-06 | Disposition: A | Discharge status: Home / Self Care | Payer: Medicaid Other | Attending: Emergency Medicine | Admitting: Emergency Medicine

## 2017-02-06 ENCOUNTER — Encounter (HOSPITAL_BASED_OUTPATIENT_CLINIC_OR_DEPARTMENT_OTHER): Payer: Self-pay

## 2017-02-06 ENCOUNTER — Emergency Department (HOSPITAL_BASED_OUTPATIENT_CLINIC_OR_DEPARTMENT_OTHER): Payer: Medicaid Other

## 2017-02-06 DIAGNOSIS — R072 Precordial pain: Secondary | ICD-10-CM

## 2017-02-06 DIAGNOSIS — I10 Essential (primary) hypertension: Secondary | ICD-10-CM

## 2017-02-06 LAB — BASIC METABOLIC PANEL
Anion gap: 7 (ref 5–15)
BUN: 15 mg/dL (ref 6–20)
CALCIUM: 9.1 mg/dL (ref 8.9–10.3)
CO2: 27 mmol/L (ref 22–32)
Chloride: 103 mmol/L (ref 101–111)
Creatinine, Ser: 1.28 mg/dL — ABNORMAL HIGH (ref 0.61–1.24)
GFR calc Af Amer: >60 mL/min (ref >60)
Glucose, Bld: 98 mg/dL (ref 65–99)
POTASSIUM: 3.6 mmol/L (ref 3.5–5.1)
Sodium: 137 mmol/L (ref 135–145)

## 2017-02-06 LAB — TROPONIN I

## 2017-02-06 MED ORDER — HYDROCHLOROTHIAZIDE 12.5 MG PO TABS: 12.5000 mg | ORAL_TABLET | Freq: Every day | ORAL | 0 refills | Status: AC

## 2017-02-06 NOTE — ED Provider Notes (Signed)
[facility] Provider Note   CSN: [id number] Arrival date & time: 02/05/17  2359     History   Chief Complaint Chief Complaint  Patient presents with  . Chest Pain    HPI Jesse Miranda is a 36 y.o. male.  The history is provided by the patient and a significant other.  Chest Pain   This is a new problem. The current episode started 3 to 5 hours ago. The problem occurs constantly. The problem has been resolved. The pain is associated with rest. The pain is present in the substernal region. The pain is moderate. Quality: "gas" The pain does not radiate. Duration of episode(s) is 10 minutes. Associated symptoms include numbness. Pertinent negatives include no abdominal pain, no back pain, no diaphoresis, no lower extremity edema, no syncope, no vomiting and no weakness. Risk factors include male gender and smoking/tobacco exposure.  His past medical history is significant for hypertension.  Pertinent negatives for past medical history include no CAD, no diabetes, no DVT, no hyperlipidemia and no strokes.  Pertinent negatives for family medical history include: no CAD.  pt reports onset of CP that lasted about 10 minutes while at rest Feels like "gas" No radiation No tearing/ripping sensation noted He also had associated HA, blurred vision and also left face/arm numbness but no weakness All symptoms resolved He has episodes of CP once/week for past year, though numbness is not always present  He is a smoker He takes testosterone, monster energy drinks and other various workout supplements No drug use reported   Past Medical History:  Diagnosis Date  . Hives   . Hypertension     Patient Active Problem List   Diagnosis Date Noted  . Left knee pain 08/13/2011    Past Surgical History:  Procedure Laterality Date  . ANTERIOR CRUCIATE LIGAMENT REPAIR    . MENISCUS REPAIR         Home Medications    Prior to Admission medications   Medication Sig Start  Date End Date Taking? Authorizing Provider  cyclobenzaprine (FLEXERIL) 10 MG tablet Take 1 tablet (10 mg total) by mouth 3 (three) times daily as needed for muscle spasms. Patient not taking: Reported on 10/28/2015 12/13/12   Geoffery Lyons, MD  EPINEPHrine (EPIPEN) 0.3 mg/0.3 mL IJ SOAJ injection Inject 0.3 mg into the muscle as needed (allergic reaction.).    [provider]  erythromycin ophthalmic ointment Place 1 application into the left eye every 6 (six) hours. Place 1/2 inch ribbon of ointment in the affected eye 4 times a day Patient not taking: Reported on 10/28/2015 12/13/13   Antony Madura, PA-C  HYDROcodone-acetaminophen (NORCO) 7.5-325 MG per tablet Take 1 tablet by mouth every 6 (six) hours as needed for moderate pain.    [provider]    Family History Family History  Problem Relation Age of Onset  . Hypertension Mother   . Hypertension Father   . Diabetes Neg Hx   . Heart attack Neg Hx   . Hyperlipidemia Neg Hx   . Sudden death Neg Hx     Social History Social History  Substance Use Topics  . Smoking status: Former Games developer  . Smokeless tobacco: Never Used     Comment: Quit 3 months ago  . Alcohol use Yes     Allergies   Ibuprofen; Lisinopril; Naproxen; and Penicillins   Review of Systems Review of Systems  Constitutional: Negative for diaphoresis.  Eyes:       Blurred vision  Cardiovascular: Positive for chest pain. Negative for syncope.  Gastrointestinal: Negative for abdominal pain and vomiting.  Musculoskeletal: Negative for back pain.  Neurological: Positive for numbness. Negative for weakness.  All other systems reviewed and are negative.    Physical Exam Updated Vital Signs BP (!) 155/91 (BP Location: Left Arm)   Pulse 67   Temp 98.6 F (37 C) (Oral)   Resp 18   Ht 1.753 m (5\' 9" )   Wt 86.2 kg (190 lb)   SpO2 100%   BMI 28.06 kg/m   Physical Exam CONSTITUTIONAL: Well developed/well nourished HEAD:  Normocephalic/atraumatic EYES: EOMI/PERRL, no nystagmus,  no ptosis ENMT: Mucous membranes moist NECK: supple no meningeal signs, no bruits CV: S1/S2 noted, no murmurs/rubs/gallops noted LUNGS: Lungs are clear to auscultation bilaterally, no apparent distress ABDOMEN: soft, nontender, no rebound or guarding GU:no cva tenderness NEURO:Awake/alert, face symmetric, no arm or leg drift is noted Equal 5/5 strength with shoulder abduction, elbow flex/extension, wrist flex/extension in upper extremities Equal 5/5 strength with hip flexion,knee flex/extension, foot dorsi/plantar flexion Cranial nerves 3/4/5/6/12/16/08/11/12 tested and intact Gait normal without ataxia No past pointing Sensation to light touch intact in all extremities EXTREMITIES: pulses normal, full ROM, no calf tenderness/edema SKIN: warm, color normal PSYCH: no abnormalities of mood noted   ED Treatments / Results  Labs (all labs ordered are listed, but only abnormal results are displayed) Labs Reviewed  BASIC METABOLIC PANEL - Abnormal; Notable for the following:       Result Value   Creatinine, Ser 1.28 (*)    All other components within normal limits  TROPONIN I    EKG  EKG Interpretation  Date/Time:  Thursday February 06 2017 00:07:02 EDT Ventricular Rate:  70 PR Interval:  186 QRS Duration: 86 QT Interval:  364 QTC Calculation: 393 R Axis:   73 Text Interpretation:  Normal sinus rhythm Normal ECG Interpretation limited secondary to artifact No significant change since last tracing Confirmed by Zadie RhineWickline, Suzzanne Brunkhorst ([id number]) on 02/06/2017 12:10:52 AM       Radiology Dg Chest 2 View  Result Date: 02/06/2017 CLINICAL DATA:  Mid to left chest pain earlier tonight, now resolved. EXAM: CHEST  2 VIEW COMPARISON:  10/28/2015 FINDINGS: The lungs are clear. The pulmonary vasculature is normal. Heart size is normal. Hilar and mediastinal contours are unremarkable. There is no pleural effusion. IMPRESSION: No active  cardiopulmonary disease. Electronically Signed   By: Ellery Plunkaniel R Mitchell M.D.   On: 02/06/2017 00:55    Procedures Procedures (including critical care time)  Medications Ordered in ED Medications - No data to display   Initial Impression / Assessment and Plan / ED Course  I have reviewed the triage vital signs and the nursing notes.  Pertinent labs & imaging results that were available during my care of the patient were reviewed by me and considered in my medical decision making (see chart for details).     2:11 AM Pt stable HEART score - 2 Appears PERC negative Will get one troponin as it has been >3hrs since CP As for numbness, discussed that he should return if this ever returns or he has any new weakness 3:36 AM Pt stable Vitals improved Troponin negative I don't feel further cardiac workup required at this time  As for numbness, currently no signs of stroke I did advised that if this should occur again, he needs to come in immediately for evaluation I don't feel TIA/CVA workup required at this time  Advised to check BP every morning  If persistently >140 for next 3 days, he will need to start HCTZ PCP referral given Discussed need to quit smoking, and cut back on all his workout supplementation    Final Clinical Impressions(s) / ED Diagnoses   Final diagnoses:  Precordial pain  Essential hypertension    New Prescriptions Discharge Medication List as of 02/06/2017  3:30 AM    START taking these medications   Details  hydrochlorothiazide (HYDRODIURIL) 12.5 MG tablet Take 1 tablet (12.5 mg total) by mouth daily., Starting Thu 02/06/2017, Print         Zadie Rhine, MD 02/06/17 [phone]

## 2017-02-06 NOTE — ED Notes (Signed)
Pt reports that his symptoms lasted about 10 minutes then all symptoms resolved. Pt in NAD at this time.

## 2017-02-06 NOTE — ED Notes (Signed)
Patient transported to X-ray 

## 2017-02-06 NOTE — Discharge Instructions (Signed)

## 2017-02-06 NOTE — ED Notes (Signed)
ED Provider at bedside. 

## 2017-02-06 NOTE — ED Triage Notes (Signed)
Pt c/o center to lt side chest pain with numbness to lt side of face and lt arm; all symptoms has resided at this time

## 2017-12-01 IMAGING — DX DG CHEST 2V
2 series · 2 of 2 positions shown · non-contrast
Comparison: 10/28/2015

CLINICAL DATA: Mid to left chest pain earlier tonight, now
resolved.

EXAM:
CHEST  2 VIEW

[chest pa]
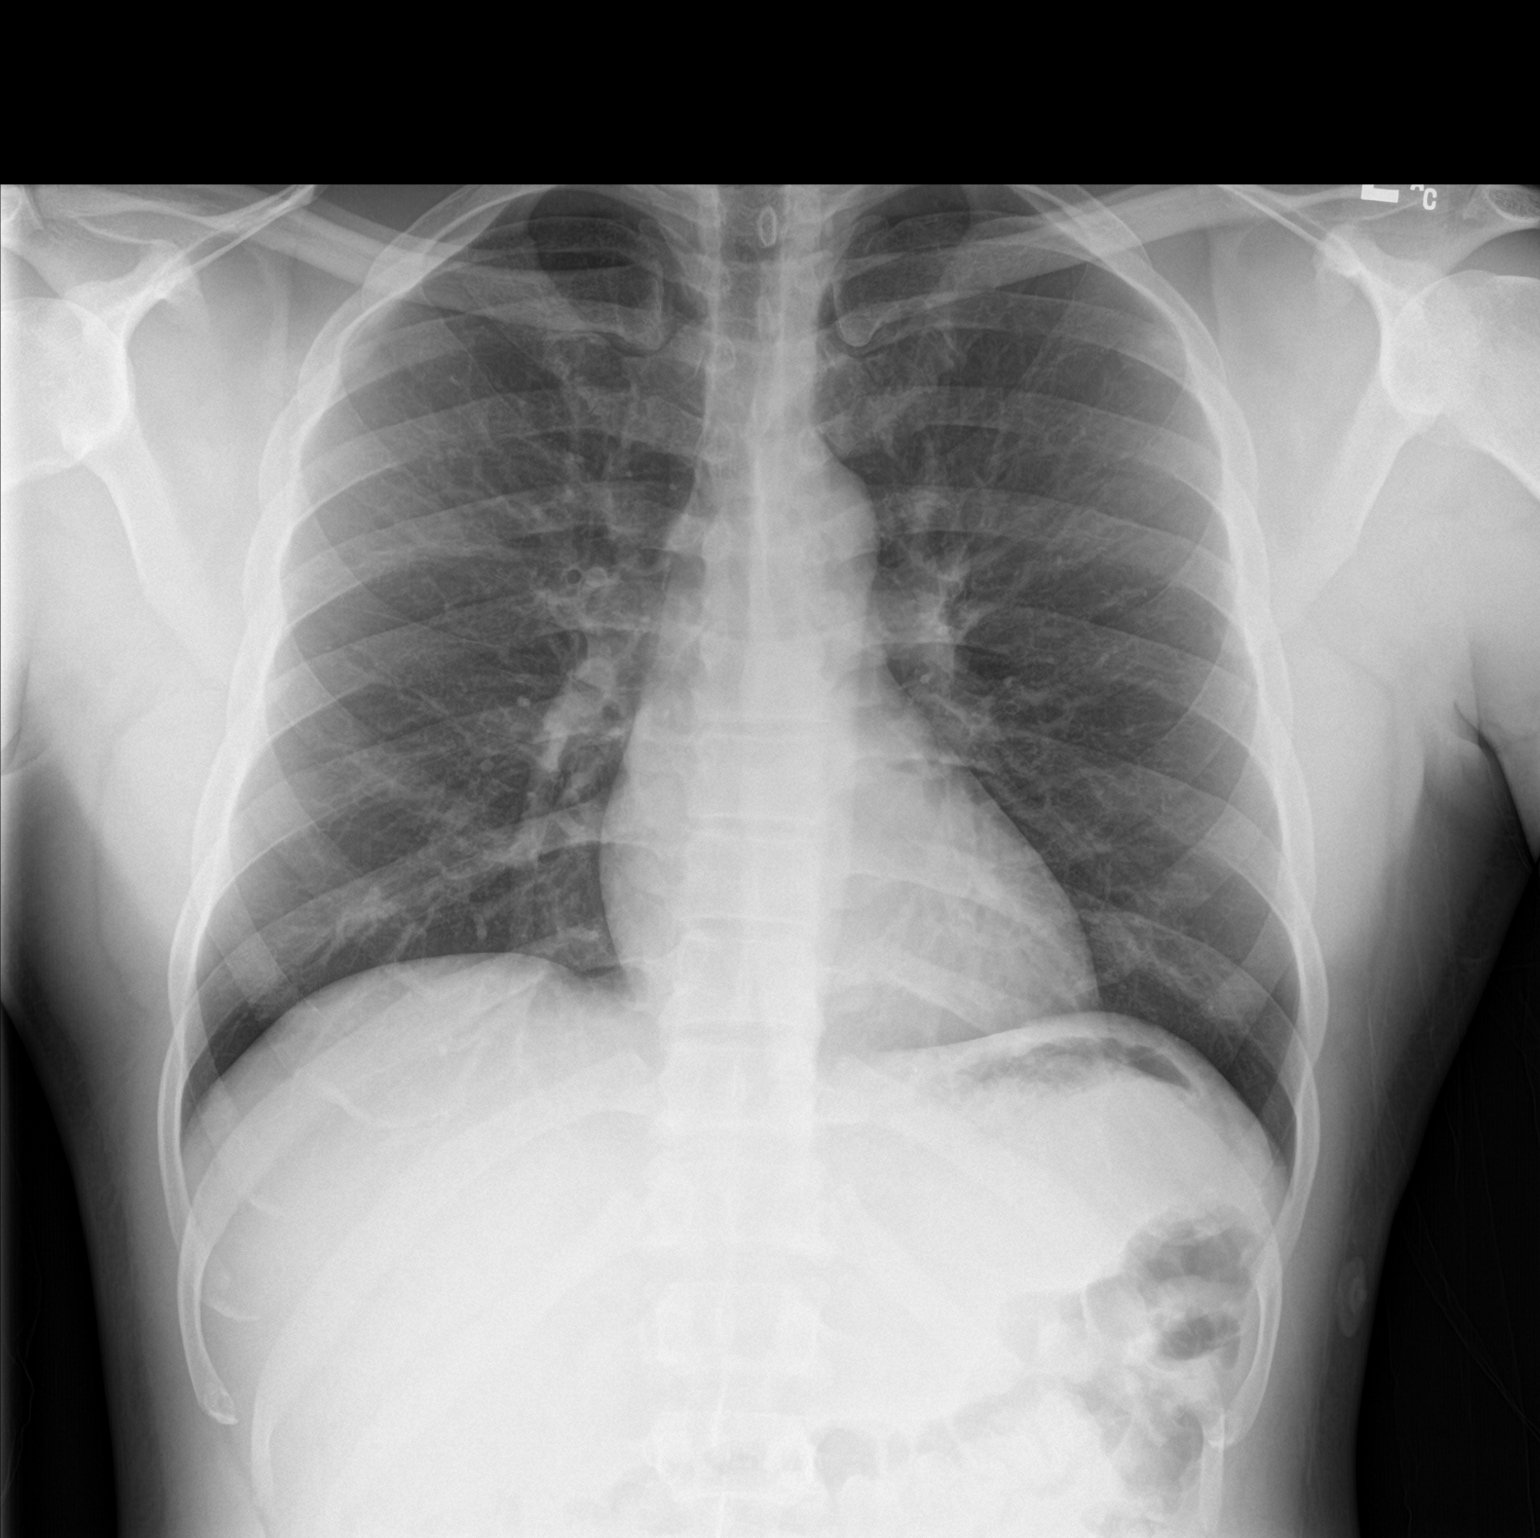

[chest lat]
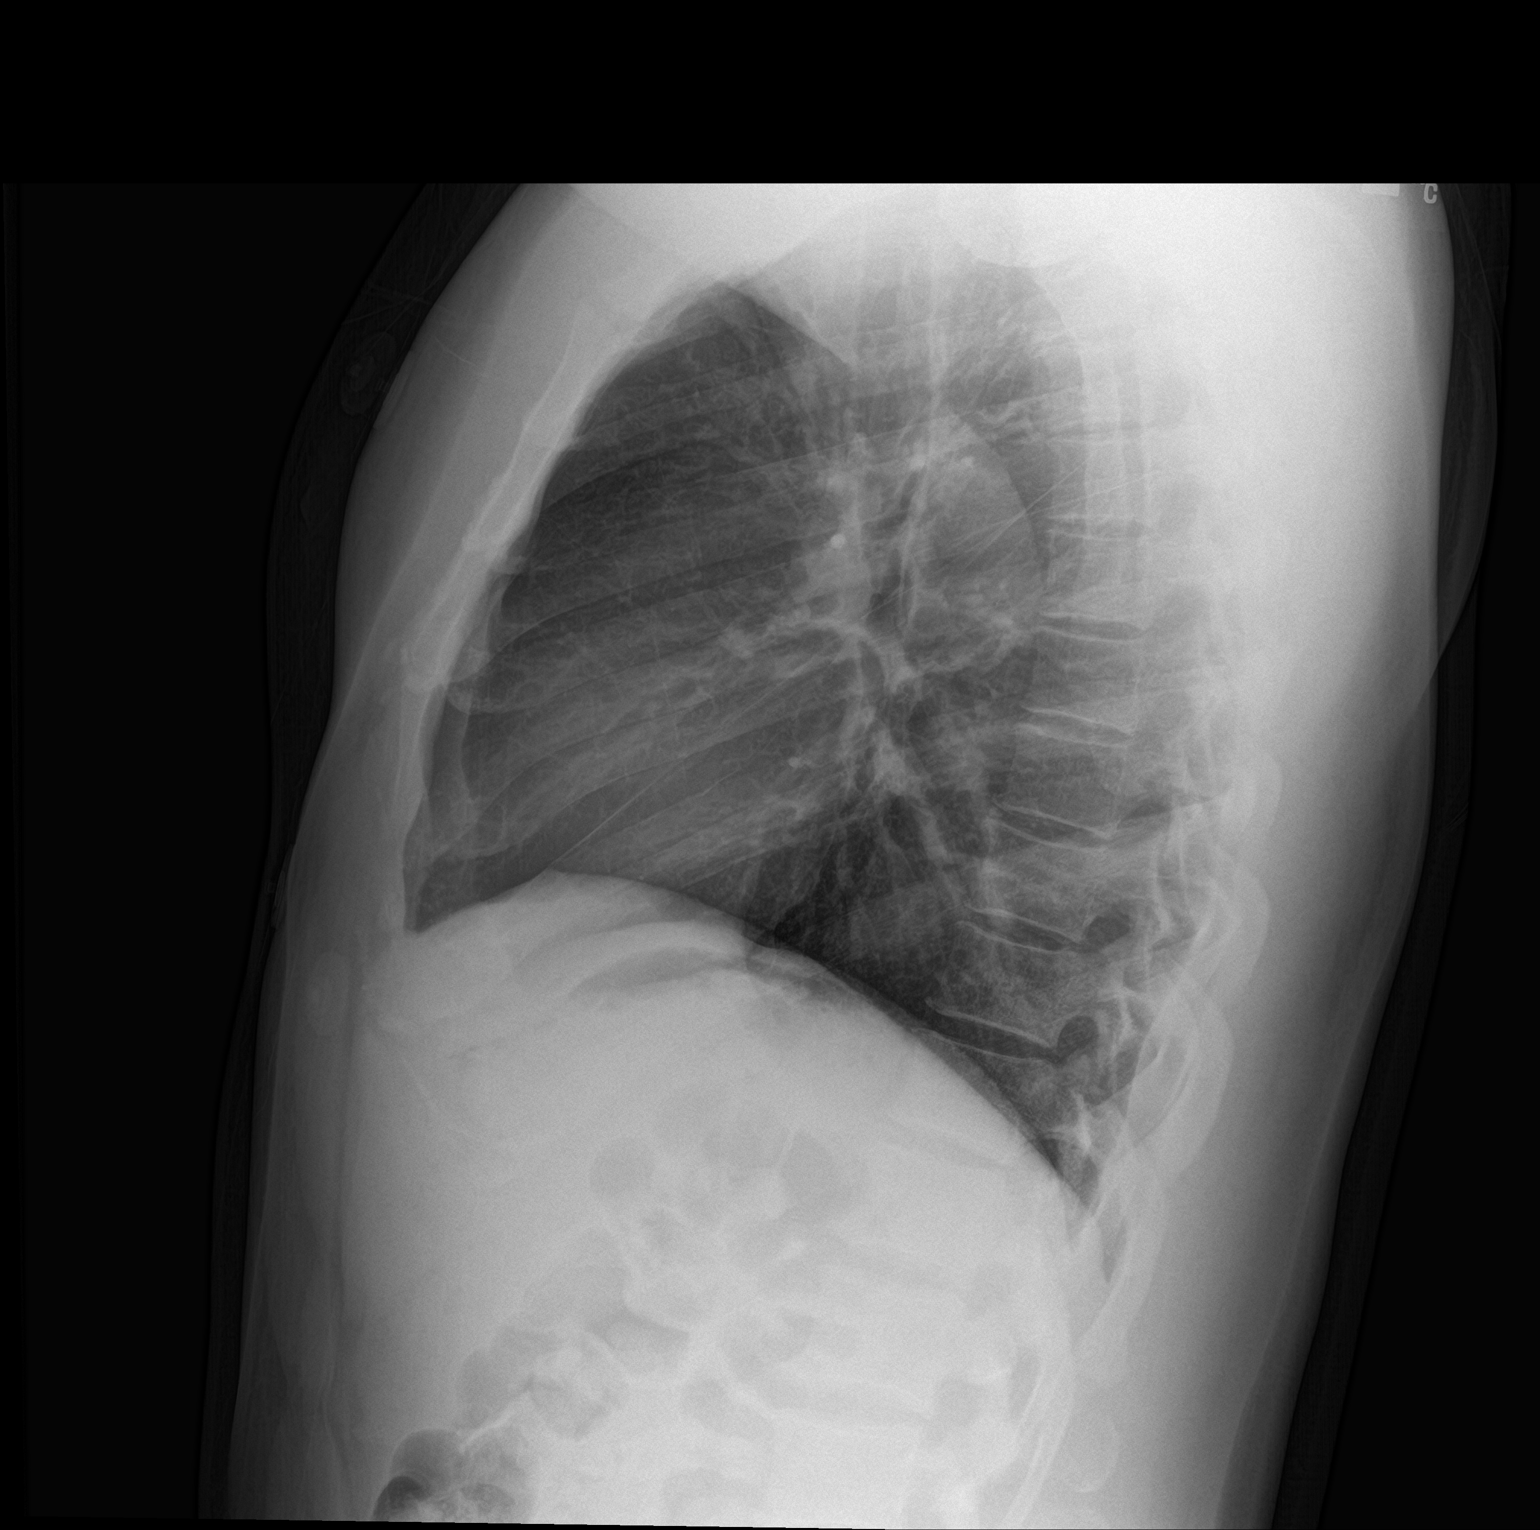

[2 of 2 positions shown; findings below may reference images not displayed]

FINDINGS: The lungs are clear. The pulmonary vasculature is normal. Heart size
is normal. Hilar and mediastinal contours are unremarkable. There is
no pleural effusion.
IMPRESSION: No active cardiopulmonary disease.
